# Patient Record
Sex: Male | Born: 1950 | Race: White | Hispanic: No | Marital: Married | Smoking: Current every day smoker
Health system: Southern US, Community
[De-identification: ages and names within clinical notes are randomized; demographics above are authoritative.]

## PROBLEM LIST (undated history)

## (undated) DIAGNOSIS — I10 Essential (primary) hypertension: Secondary | ICD-10-CM

## (undated) DIAGNOSIS — G4730 Sleep apnea, unspecified: Secondary | ICD-10-CM

## (undated) DIAGNOSIS — J189 Pneumonia, unspecified organism: Secondary | ICD-10-CM

## (undated) DIAGNOSIS — J449 Chronic obstructive pulmonary disease, unspecified: Secondary | ICD-10-CM

## (undated) DIAGNOSIS — M48061 Spinal stenosis, lumbar region without neurogenic claudication: Secondary | ICD-10-CM

## (undated) HISTORY — PX: SHOULDER ARTHROSCOPY W/ ROTATOR CUFF REPAIR: SHX2400

## (undated) HISTORY — PX: JOINT REPLACEMENT: SHX530

## (undated) HISTORY — PX: TOTAL HIP ARTHROPLASTY: SHX124

## (undated) HISTORY — PX: APPENDECTOMY: SHX54

## (undated) HISTORY — PX: OTHER SURGICAL HISTORY: SHX169

---

## 1999-10-08 ENCOUNTER — Encounter: Payer: Self-pay | Admitting: Vascular Surgery

## 1999-10-13 ENCOUNTER — Ambulatory Visit: Admission: RE | Admit: 1999-10-13 | Discharge: 1999-10-13 | Payer: Self-pay | Admitting: Vascular Surgery

## 1999-10-15 ENCOUNTER — Encounter (INDEPENDENT_AMBULATORY_CARE_PROVIDER_SITE_OTHER): Payer: Self-pay | Admitting: Specialist

## 1999-10-15 ENCOUNTER — Inpatient Hospital Stay: Admission: RE | Admit: 1999-10-15 | Discharge: 1999-10-17 | Payer: Self-pay | Admitting: Vascular Surgery

## 2001-01-18 ENCOUNTER — Encounter: Payer: Self-pay | Admitting: Vascular Surgery

## 2001-01-19 ENCOUNTER — Ambulatory Visit (HOSPITAL_COMMUNITY): Admission: RE | Admit: 2001-01-19 | Discharge: 2001-01-19 | Payer: Self-pay | Admitting: Vascular Surgery

## 2001-01-21 ENCOUNTER — Inpatient Hospital Stay (HOSPITAL_COMMUNITY): Admission: RE | Admit: 2001-01-21 | Discharge: 2001-01-23 | Payer: Self-pay | Admitting: Vascular Surgery

## 2001-01-21 ENCOUNTER — Encounter: Payer: Self-pay | Admitting: Vascular Surgery

## 2005-07-06 ENCOUNTER — Emergency Department (HOSPITAL_COMMUNITY): Admission: EM | Admit: 2005-07-06 | Discharge: 2005-07-06 | Payer: Self-pay | Admitting: Emergency Medicine

## 2006-10-07 ENCOUNTER — Ambulatory Visit (HOSPITAL_COMMUNITY): Admission: RE | Admit: 2006-10-07 | Discharge: 2006-10-07 | Payer: Self-pay | Admitting: Vascular Surgery

## 2006-10-20 ENCOUNTER — Inpatient Hospital Stay (HOSPITAL_COMMUNITY): Admission: RE | Admit: 2006-10-20 | Discharge: 2006-10-22 | Payer: Self-pay | Admitting: Vascular Surgery

## 2006-10-21 ENCOUNTER — Encounter: Payer: Self-pay | Admitting: Vascular Surgery

## 2006-11-02 ENCOUNTER — Ambulatory Visit: Payer: Self-pay | Admitting: Vascular Surgery

## 2007-05-24 ENCOUNTER — Ambulatory Visit: Payer: Self-pay | Admitting: Vascular Surgery

## 2007-10-31 ENCOUNTER — Ambulatory Visit: Payer: Self-pay | Admitting: Vascular Surgery

## 2010-12-09 ENCOUNTER — Other Ambulatory Visit (HOSPITAL_COMMUNITY): Payer: Self-pay

## 2010-12-09 ENCOUNTER — Other Ambulatory Visit (HOSPITAL_COMMUNITY): Payer: Self-pay | Admitting: Orthopedic Surgery

## 2010-12-09 DIAGNOSIS — Z9889 Other specified postprocedural states: Secondary | ICD-10-CM

## 2011-02-10 NOTE — Procedures (Signed)
BYPASS GRAFT EVALUATION   INDICATION:  Followup, bilateral fem-pop bypass graft.   HISTORY:  Diabetes:  No.  Cardiac:  No.  Hypertension:  Yes.  Smoking:  Yes.  Previous Surgery:  Please see above.   SINGLE LEVEL ARTERIAL EXAM                               RIGHT              LEFT  Brachial:                    145                148  Anterior tibial:             129                140  Posterior tibial:            127                146  Peroneal:  Ankle/brachial index:        0.87               0.99   PREVIOUS ABI:  Date: 05/24/07  RIGHT:  0.97  LEFT:  >1.0   LOWER EXTREMITY BYPASS GRAFT DUPLEX EXAM:   DUPLEX:  Patent bilateral fem-pop bypass graft with increased velocities  noted in the left inflow area.   IMPRESSION:  1. Patent bilateral femoropopliteal bypass graft with increased      velocity noted at the left inflow area.  2. Normal ankle brachial index with biphasic Doppler arterial waveform      noted in the left leg.  3. Mildly abnormal ankle brachial index with biphasic Doppler arterial      waveform noted in the right leg.   ___________________________________________  Quita Skye. Hart Rochester, M.D.   MG/MEDQ  D:  10/31/2007  T:  11/01/2007  Job:  [id number]

## 2011-02-10 NOTE — Procedures (Signed)
BYPASS GRAFT EVALUATION   INDICATION:  Follow up bilateral bypass grafts.   HISTORY:  Diabetes:  No  Cardiac:  No  Hypertension:  Yes  Smoking:  Less than one pack per day  Previous Surgery:  Right femoral to below-knee popliteal artery bypass  graft with nonreversed saphenous vein on 10/20/2006 by Dr. Hart Rochester.  Left femoral to below-knee popliteal artery bypass graft with  nonreversed saphenous vein on 01/21/2001 by Dr. Hart Rochester.  The patient is also status post right thromboendarterectomy of the  distal right external iliac, right common femoral and origin of the  superficial femoral with DPA on 10/05/1999 by Dr. Hart Rochester.   SINGLE LEVEL ARTERIAL EXAM                               RIGHT              LEFT  Brachial:                    140                140  Anterior tibial:             120                130  Posterior tibial:            136                164  Peroneal:  Ankle/brachial index:        0.97               Greater than 1.0   PREVIOUS ABI:  Date: 10/05/2006  RIGHT:  0.49  LEFT:  Greater than 1.0   LOWER EXTREMITY BYPASS GRAFT DUPLEX EXAM:   DUPLEX:  Doppler arterial waveforms are biphasic proximal to, throughout  and distal to the grafts bilaterally.  Elevated velocities in the left  common femoral artery are stable from previous exam.   IMPRESSION:  Patent bilateral femoral to popliteal artery bypass grafts.  Left ABI is stable, right ABI is improved from previous exam.   ___________________________________________  Quita Skye. Hart Rochester, M.D.   DP/MEDQ  D:  05/24/2007  T:  05/25/2007  Job:  [id number]

## 2011-02-13 NOTE — Discharge Summary (Signed)
[name], [name] NO.:  [id number]   MEDICAL RECORD NO.:  [id number]          PATIENT TYPE:  INP   LOCATION:  2033                         FACILITY:  [facility]   PHYSICIAN:  Constance Holster, PA    DATE OF BIRTH:  September 01, 1951   DATE OF ADMISSION:  10/20/2006  DATE OF DISCHARGE:                               DISCHARGE SUMMARY   DATE OF DISCHARGE:  Tentatively 1-2 days.   ADMISSION DIAGNOSIS:  Severe right calf claudication secondary to  femoral popliteal occlusive disease.   DISCHARGE DIAGNOSES:  1. 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  2. Hypertension.  3. Hyperlipidemia.   PROCEDURES:  October 20, 2006, the patient underwent a right common  popliteal below knee bypass graft in a non-reverse translocated  saphenous venous graft to the right leg with intraoperative arteriogram  by Dr. Estelle June.   HISTORY AND PHYSICAL:  This 60 year old male with past medical history  of hypertension and a history of lower extremity occlusive disease,  having undergone previously a left femoral-popliteal bypass graft by Dr.  Hart Rochester in April 2002 and a right femoral endarterectomy and distal  external iliac endarterectomy in January 2001.  The patient has an  excellent result; however, the left __________ developed progressive  claudication, is now able to walk 50 yards without having to stop  because of severe right calf discomfort.  The patient will occasionally  develop numbness in the right foot at rest.  He denies any history of  rest pain or non-healing ulcers, and when he attempted to play golf with  his son a few weeks ago, he was unable to proceed after one hole because  of his symptoms.  Angiography was performed on January 10th, which  revealed diffuse right superficial femoral occlusive disease with right  popliteal occlusion and three-vessel runoff below the knee, through the  below-the-knee popliteal artery.   Thus, __________ patient undergo left  to right femoral-popliteal bypass graft.  Risks and benefits are  explained.  The patient agreed to proceed.   HOSPITAL COURSE:  The patient's hospital course has been uneventful, and  he has progressed as expected.  On October 20, 2006, the patient  underwent a right femoral below-the-knee bypass graft without any  complications.  After surgery, the patient was transferred to step-down  unit on 3,300.  His blood pressure was stable at 160/55 a heart rate in  the 90s.   Post-op day #1, the patient's vital signs were stable.  He is afebrile.  The patient had a good appetite.  His Foley was discontinued.  The  patient's right foot was warm with a 2-3 dorsalis pedis palpable pulse.  The patient's incisions were dry.  The patient was hemoglobin and  hematocrit were stable.  His potassium is 3.7.  He was transferred to  2000.  The patient is to get out of bed and ambulate.   DISCHARGE DISPOSITION:  The patient will be discharged home the next 1  to 2 days, provided he is ambulating well, vital signs are  stable and he  remains afebrile.   MEDICATIONS:  1. Tylox 1-2 tablets every 4 hours p.r.n.  2. Lipitor 20 mg p.o. daily.  3. Aspirin 81 mg p.o. daily.   INSTRUCTIONS:  Patient instructed follow a low-fat, low-salt diet.  No  driving or heavy lifting greater than 10 pounds for 2 weeks.  The  patient is to ambulate 3 to 4 times daily and increase activity as  tolerated.  He may shower and clean his incisions with mild soap and  water.  Call the office if any wound  problems shall arise such as  incision erythema, drainage, temperature greater than 101.5.   FOLLOW UP:  The patient will follow with Dr. Hart Rochester in 3 weeks with  ABIs.  The office will contact him with time and date of appointment.      Constance Holster, PA     JMW/MEDQ  D:  10/21/2006  T:  10/21/2006  Job:  [id number]   cc:   Patient Hospital Chart  Quita Skye. Hart Rochester, M.D.

## 2011-02-13 NOTE — Op Note (Signed)
NAMEBASSEL, GASKILL                ACCOUNT NO.:  [id number]   MEDICAL RECORD NO.:  [id number]          PATIENT TYPE:  AMB   LOCATION:  SDS                          FACILITY:  [facility]   PHYSICIAN:  Quita Skye. Hart Rochester, M.D.  DATE OF BIRTH:  1951/01/22   DATE OF PROCEDURE:  10/07/2006  DATE OF DISCHARGE:                               OPERATIVE REPORT   PREOPERATIVE DIAGNOSIS:  Right superficial femoral occlusive disease  with severe claudication, right leg.   POSTOPERATIVE DIAGNOSIS:  Right superficial femoral occlusive disease  with severe claudication, right leg.   PROCEDURE:  Aortobifemoral angiogram with bilateral lower extremity  runoff via right common femoral approach.   SURGEON:  Quita Skye. Hart Rochester, M.D.   ANESTHESIA:  Local Xylocaine, contrast 220 mL.   COMPLICATIONS:  None.   DESCRIPTION OF PROCEDURE:  The patient was taken to [facility]  peripheral endovascular placed in the supine position at which time both  groins were prepped with Betadine solution, draped in routine sterile  manner.  After infiltration of 1% Xylocaine, right common femoral artery  was entered percutaneously.  A Rosen guide wire passed into the  suprarenal aorta under fluoroscopic guidance.  5-French sheath and  dilator were passed over the guide wire, dilator removed.  A standard  pigtail catheter positioned in the suprarenal aorta.  Flush abdominal  aortogram was performed injecting 20 mL contrast 20 mL per second.  This  revealed the aorta to be widely patent with single renal arteries  bilaterally which were widely patent, no evidence of stenosis.  Aorta  was patent down to the bifurcation with both common, internal and  external iliac arteries relatively unremarkable until the distal aspect  on the right side.  Catheter was withdrawn into the terminal aorta and  bilateral lower extremity runoff performed injecting 88 mL contrast 8 mL  per second.  This revealed the right external iliac to have a  relatively  focal stenosis in its midportion approximating 60% in severity with a  dilated distal external iliac and common femoral artery where previous  patch angioplasty been performed.  On the right side the superficial  femoral artery was patent proximally but did have significant disease in  the midportion over a 2 cm segment, was then patent down to above the  knee where was totally occluded.  The below-knee popliteal artery was  widely patent with three-vessel runoff.  This was confirmed by different  views using the peak hold technique.  On the left side, the external  iliac artery was widely patent, there was a patent left femoral to below-  knee popliteal bypass with three-vessel runoff of with no areas of  stenosis.  Having tolerated procedure well, sheath was removed, adequate  compression applied.  No complications ensued.   FINDINGS:  1. Widely patent aortobi-iliac system with exception of a 60% focal      right external iliac stenosis at the proximal tip of the previous      patch angioplasty.  2. Widely patent left femoral-popliteal bypass graft with three-vessel      runoff.  3. Diffuse  severe right superficial femoral occlusive disease with      moderately severe stenosis in the mid thigh and total occlusion      above-the-knee with reconstitution of below-knee popliteal artery      and three-vessel runoff on the right.           ______________________________  Quita Skye. Hart Rochester, M.D.     JDL/MEDQ  D:  10/07/2006  T:  10/07/2006  Job:  [id number]

## 2011-02-13 NOTE — Op Note (Signed)
[name], [name]                ACCOUNT NO.:  [id number]   MEDICAL RECORD NO.:  [id number]          PATIENT TYPE:  INP   LOCATION:  2550                         FACILITY:  [facility]   PHYSICIAN:  Quita Skye. Hart Rochester, M.D.  DATE OF BIRTH:  1950/11/24   DATE OF PROCEDURE:  10/20/2006  DATE OF DISCHARGE:                               OPERATIVE REPORT   PREOPERATIVE DIAGNOSIS:  Severe right superficial femoral, popliteal and  tibial occlusive disease with severe claudication, right leg.   POSTOPERATIVE DIAGNOSIS:  Severe right superficial femoral, popliteal  and tibial occlusive disease with severe claudication, right leg.   OPERATIONS:  Right common femoral to popliteal (below knee) bypass using  a non-reversed, translocated saphenous vein graft from right leg with  intraoperative arteriogram.   SURGEON:  Quita Skye. Hart Rochester, M.D.   FIRST ASSISTANT:  Rowe Clack, P.A.-C.   ANESTHESIA:  General endotracheal.   PROCEDURE:  The patient was taken to the operating room, placed in  supine position at which time satisfactory general endotracheal  anesthesia was administered.  The right leg was prepped with Betadine  scrub and solution and draped in a routine sterile manner.  Longitudinal  incision was made through the previous scar in the right groin, carried  down to subcutaneous tissue.  The common femoral and distal external  iliac artery had previously been treated with endarterectomy and patch  angioplasty and this was dissected free for proximal control down to the  origin of the profunda.  There was excellent pulse.  Saphenous vein was  exposed at the saphenofemoral junction and removed down to the mid calf  through multiple incisions on the medial aspect of right leg.  Its  branches were ligated with 4-0 and 5-0 silk ties, divided.  It was  removed and gently dilated heparinized saline and marked for orientation  purposes.  It was an adequate vein being at least 2.5 mm in size  throughout.  The popliteal artery was exposed in the below-knee  position.  It was a diseased vessel, particularly distally but was  widely patent on the angiogram and was relatively soft proximally.  Subfascial tunnel was created.  The patient was then heparinized.  The  femoral vessels were occluded with vascular clamps.  Longitudinal  opening made through the previous Dacron hood of the Dacron patch over  the common femoral artery and enlarged with am 11-blade.  Proximal end  of the saphenous vein was anastomosed end-to-side with 6-0 Prolene.  Clamps then released.  There was good pulse down to the first set of  competent valves.  Using a retrograde valvulotome the valves were  rendered incompetent with resultant excellent flow out of the distal in  the vein graft.  Vein was carefully delivered through the tunnel and  anastomosed end-to-side to the below-knee popliteal artery with 6-0  Prolene.  The vessel would easily accept a 2.5 mm to 3 mm dilator  distally although it was diseased with plaque.  Following completion of  the anastomosis, intraoperative arteriogram was performed which revealed  a widely patent anastomosis and the runoff primarily through  the  anterior tibial and  peroneal arteries.  Protamine was given to reverse the heparin.  Following adequate hemostasis, wound was irrigated with saline, closed  in layers with Vicryl subcuticular fashion.  Sterile dressing applied.  The patient taken to recovery room in satisfactory condition.           ______________________________  Quita Skye Hart Rochester, M.D.     JDL/MEDQ  D:  10/20/2006  T:  10/20/2006  Job:  [id number]

## 2011-02-13 NOTE — H&P (Signed)
[name], [name]                ACCOUNT NO.:  [id number]   MEDICAL RECORD NO.:  [id number]          PATIENT TYPE:  AMB   LOCATION:  SDS                          FACILITY:  [facility]   PHYSICIAN:  Quita Skye. Hart Rochester, M.D.  DATE OF BIRTH:  07-27-1951   DATE OF ADMISSION:  10/07/2006  DATE OF DISCHARGE:                              HISTORY & PHYSICAL   CHIEF COMPLAINT:  Severe right calf claudication secondary to femoral  popliteal occlusive disease.   HISTORY OF PRESENT ILLNESS:  This 60 year old male patient has a history  of lower extremity occlusive disease, having previously undergone a left  femoral-popliteal bypass grafting by me in April 2002 and a right  femoral endarterectomy and distal external iliac endarterectomy in  January of 2001.  He has had an excellent result, but over the last 8  weeks has developed progressive claudication and is now unable to walk  50 yards without having to stop because of severe right calf discomfort.  He will occasionally developed numbness in the right foot at rest.  He  has no history of rest pain or nonhealing ulcers, and when he attempted  to play golf with his son a few weeks ago, he was unable to proceed  after one hole because of his symptoms.   PAST MEDICAL HISTORY:  Is negative for diabetes, coronary artery  disease, COPD, stroke.  He does have a history of hypertension which was  treated medically in the past but has not been treated for the past 2  years and is doing well.  Does have hyperlipidemia.   MEDICATIONS INCLUDE:  Lipitor 20 mg p.o. daily and aspirin 81 mg p.o.  daily.   FAMILY HISTORY:  Is positive for stroke in his mother, diabetes in a  grandmother and negative for coronary artery disease.   SOCIAL HISTORY:  Married, has three children, works in Insurance account manager.  He  smokes one pack of cigarettes per day; he is trying to quit.  Does not  use alcohol.   REVIEW OF SYSTEMS:  Denies any chest pain, dyspnea on exertion, PND,  orthopnea, anorexia, weight loss, abdominal symptoms such as  hematemesis, melena, recent changes in bowel habits or significant  urologic dysfunction.   ALLERGIES:  None known.   On physical exam, blood pressure 140/84, heart rate 76, respirations are  18.  GENERAL:  He is a healthy-appearing, middle-aged male who is in no  apparent distress.  Alert and oriented x3.  His neck is supple with 3+ carotid pulses palpable.  No bruits were  audible.  There was no thyromegaly palpable.  NEUROLOGIC EXAM:  Is normal.  UPPER EXTREMITY EXAM:  Reveals 3+ pulses bilaterally at the brachial and  radial level.  CHEST:  Clear to auscultation.  CARDIOVASCULAR EXAM:  Reveals a regular rhythm with no murmurs.  ABDOMEN:  Soft, nontender with no palpable masses.  Right leg has a 3+ femoral pulse.  No popliteal or distal pulses are  palpable.  There is adequate perfusion at rest with no ulcerations or  ischemia.  Left leg has a 3+ femoral pulse, 2+ popliteal  and 2+ dorsalis  pedis pulse palpable.   Angiography was performed on January 10 which revealed diffuse right  superficial femoral occlusive disease with right popliteal occlusion and  three-vessel runoff below the knee through the below-knee popliteal  artery.   IMPRESSION:  1. Severe right femoral popliteal occlusive disease with severe      claudication, right leg.  2. Status post left femoral-popliteal bypass grafting with good      result.  3. Hyperlipidemia.  4. Tobacco abuse.   PLAN:  Is to admit the patient on January 23 for an elective right  femoral-popliteal bypass graft.  Risks and benefits have been fully  discussed with him and his wife, and they would like to proceed.           ______________________________  Quita Skye Hart Rochester, M.D.     JDL/MEDQ  D:  10/07/2006  T:  10/07/2006  Job:  [id number]   cc:   Adalberto Cole

## 2011-02-13 NOTE — Discharge Summary (Signed)
[place]  Patient:    [name], [name]                       MRN: [id number] Adm. Date:  [id number] Disc. Date: [id number] Attending:  Colvin Caroli Dictator:   Lissa Merlin, P.A.                           Discharge Summary  ADDENDUM:  Mr. Pipkins will not have Percocet for pain.  He will instead have Darvocet-N 100 one to two p.o. q.4-6h. p.r.n. for pain. DD:  01/22/01 TD:  01/24/01 Job: [id number]

## 2011-02-13 NOTE — H&P (Signed)
[place]  Patient:    [name], [name]                         MRN: [id number] Adm. Date:  01/21/01 Attending:  Quita Skye. Hart Rochester, M.D. Dictator:   Durenda Age, P.A.-C.                         History and Physical  DATE OF BIRTH:  [birth year]  CHIEF COMPLAINT:  Left femoral-popliteal occlusive disease.  HISTORY OF PRESENT ILLNESS:  A 60 year old white male with known history of PVOD, presented on January 18, 2001, with complaints of claudication symptoms in the left calf, after walking less than 30 yards, having to stop around 60 yards after starting.  Dopplers at the office revealed a left ABI of 0.68, with a right ABI of 0.098.  This was essentially unchanged from the prior Dopplers on November 18, 1999, (left ABI 0.68).  However, due to his limiting symptoms, the patient request surgical treatment.  He underwent an arteriogram on January 19, 2001, which apparently confirmed the diagnosis; no records are available at this time.  Upon evaluation, Dr. Hart Rochester recommended that the patient undergo left femoral-popliteal bypass graft, possibly using vein, on January 21, 2001, for the relief of symptoms.  No buttock, hip, thigh, or foot pain.  The pain is localized in the calf.  The pain is not present at rest or at night.  He denies any slow healing ulcers, gangrenous changes, or ischemic changes.  No peripheral edema or decrease in temperature.  No shortness of breath or dyspnea on exertion after walking.  No chest pain or palpitations.  PAST MEDICAL HISTORY:  PVOD - claudication, hypertension, continuous tobacco abuse, although he is trying to quit.  Hypercholesterolemia.  DJD.  PAST SURGICAL HISTORY:  Status post angiogram on January 19, 2001, Dr. Hart Rochester to both right distal external iliac, right common femoral artery and proximal right superficial femoral artery.  Endarterectomy with Dacron patch angioplasty on October 15, 1999, Dr. Hart Rochester.  Status  post angiogram on October 13, 1999, Dr. Hart Rochester.  MEDICATIONS: 1. Plavix 75 mg p.o. q.d. 2. Hyzaar 50 -12.5 mg q.d.  ALLERGIES:  NKDA.  REVIEW OF SYSTEMS:  See HPI and past medical history for significant positives.  No diabetes, kidney disease or asthma.  FAMILY HISTORY:  Mother alive with a history of CVA, also of diabetes.  Father alive and well.  SOCIAL HISTORY:  Married, three children.  He is an Geologist, engineering in a factory. He smokes 1-1/2 packs a day of cigarettes for 30 years, although he is seriously trying to quit.  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  He only drinks alcohol on social occasions.  PHYSICAL EXAMINATION:  GENERAL:  A well-developed, well-nourished, 60 year old, white male in no acute distress, alert and oriented x 3.  VITAL SIGNS:  Blood pressure 110/70, pulse 80, respirations 16.  HEENT:  Head:  Normocephalic, atraumatic.  PERRLA.  EOMI.  Funduscopic examination within normal limits.  NECK:  Supple.  No JVD, bruits, or lymphadenopathy.  CHEST:  Symmetrical on inspirations.  LUNGS:  Show mild anterior wheezing, and a trace of diffuse rhonchi bilaterally.  These sounds appear to be chronic.  No rales or lymphadenopathy.  CARDIOVASCULAR:  Regular rate and rhythm.  No murmurs, rubs or gallops.  ABDOMEN:  Soft, nontender.  Bowel sounds x 4.  No masses  or bruits.  GU/RECTAL:  Deferred.  EXTREMITIES:  No clubbing, cyanosis, or edema.  In the left groin, there is a presence of ecchymosis, after the patient had undergone an angiogram, but no visible infection is present in that area.  No bruit audible.  He has bilateral tattoos to his arms.  Temperature warm.  PULSES:  Peripheral pulses, carotid 2+ bilaterally.  Femoral 2+ bilaterally. Popliteal dorsalis pedis and posterior tibialis 2+ on the right, absent on the left.  NEUROLOGICAL:  Nonfocal.  Gait steady.  DTRs 2+ bilaterally.  Muscle strength 5/5.  ASSESSMENT AND PLAN:   Left femoral-popliteal occlusive disease for left femoral-popliteal bypass graft on January 21, 2001, Dr. Hart Rochester.  Dr. Hart Rochester has seen and evaluated this patient prior to the admission and has explained the risks and benefits involving the procedure and the patient consented to continue. DD:  01/20/01 TD:  01/20/01 Job: [id number]

## 2011-02-13 NOTE — Discharge Summary (Signed)
[name]. [facility]  Patient:    [name], [name]                       MRN: [id number] Adm. Date:  [id number] Disc. Date: [id number] Attending:  Colvin Caroli Dictator:   Lissa Merlin, P.A. CC:         Truddie Crumble, M.D.   Discharge Summary  DATE OF BIRTH:  2051-08-07  PRIMARY CARE PHYSICIAN:  Truddie Crumble, M.D.  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  PREVIOUS MEDICAL HISTORY: 1. 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 2. Hypertension. 3. Hypercholesterolemia. 4. Smoking. 5. Degenerative joint disease.  PROCEDURES: 1. Abdominal aortogram with bilateral lower extremity runoff on January 19, 2001. 2. Left femoral to below the knee popliteal bypass graft with nonreverse    greater saphenous vein on January 21, 2001.  BRIEF HISTORY:  [name] is a 60 year old male with known peripheral vascular occlusive disease and previous endarterectomy in January of 2001 as outlined above.  He has been doing well with regard to that procedure, however, he began to become limited in his activities secondary to left calf claudication.  He was referred to [facility] Sky[facility]. Hart Rochester, M.D., who arranged for angiography.  He also discussed left femoropopliteal bypass graft with Mr. Wyss.  The risks, benefits, details, and alternatives were discussed.  HOSPITAL COURSE:  [name] came into the hospital on January 19, 2001, for the elective procedure.  He underwent abdominal aortogram with bilateral lower extremity runoff.  His bilateral renal arteries were widely patent.  His left superficial femoral artery was occluded with reconstitution of above the knee popliteal and three-vessel runoff.  He had a widely  patent right SFA, popliteal, and three vessel runoff.  It was agreed to do left femoropopliteal.  He underwent left femoral to below the knee popliteal bypass graft on January 21, 2001, with no complications.  He was taken to the PACU in stable condition.  Postoperatively [name] has done very well with no problems. He has done well with routine care.  He currently is afebrile with stable vital signs.  He has good pulses in his lower extremities.  Laboratory work is satisfactory.  It is anticipated that he should be suitable for discharge pending satisfactory morning rounds on January 23, 2001.  DISCHARGE MEDICATIONS: 1. Plavix 75 mg one p.o. q.d. 2. Hyzaar 12.5 mg one p.o. q.d. 3. Percocet 5/325 mg one to two p.o. q.4-6h. p.r.n. for pain.  ALLERGIES:  No known drug allergies.  ACTIVITY:  Mr. Schwer is to do no driving and no heavy lifting or strenuous activity.  He is told to walk daily and to stay active.  WOUND CARE:  He is told that he can shower.  He is told to watch his wounds for increasing redness, swelling, drainage, or fever and to clean them gently with soap and water.  SPECIAL INSTRUCTIONS:  He is told to stop smoking.  FOLLOW-UP: 1. He will [facility] Sky[facility]. Hart Rochester, M.D., in two to three weeks after discharge with    repeat ABIs. 2. He will return to the CVTS office around Feb 01, 2001, for staple    discontinuation. DD:  01/22/01 TD:  01/24/01 Job: [id number]

## 2011-02-13 NOTE — Procedures (Signed)
[place]. [facility]  Patient:    [name], [name]                       MRN: [id number] Proc. Date: 01/19/01 Adm. Date:  [id number] Attending:  Colvin Caroli                           Procedure Report  PREOPERATIVE DIAGNOSIS:  Claudication left lower extremity secondary to femoral-popliteal occlusive disease.  PROCEDURE:  Abdominal aortogram with bilateral lower extremity runoff via left common femoral approach.  SURGEON:  Quita Skye. Hart Rochester, M.D.  ANESTHESIA:  Local Xylocaine and Versed 2 mg intravenously.  DESCRIPTION OF PROCEDURE:  The patient was taken to the [facility] lab, placed in the supine position, at which time both groins were prepped with Betadine solution, draped in a routine sterile manner. After infiltration of 1% Xylocaine, the left common femoral artery was entered percutaneously and guidewire passed into the suprarenal aorta under fluoroscopic guidance.  A 5-French sheath and dilator were passed over the guidewire.  Dilator removed and a standard pigtail catheter positioned in the suprarenal aorta.  A flush abdominal aortogram was performed injecting 20 cc of contrast at 20 cc per second.  This revealed the aorta to be widely patent with two renal arteries on each side, both of which were widely patent on each side.  The largest of these were the superior renal arteries bilaterally. Inferior mesenteric artery was also patent.  Both common iliac arteries were widely patent with no evidence of significant occlusive disease.  The catheter was withdrawn into the terminal aorta and bilateral lower extremity angiography performed by injecting 88 cc of contrast at 8 cc per second.  This revealed both common iliac and internal iliac arteries to be widely patent. The external iliac on the left appeared to be mildly narrowed.  The right side had had a previous patch angioplasty and was widely patent across the  inguinal ligament region.  The common femoral artery on the right, superficial femoral artery, profunda femoris artery, popliteal artery were all patent and there was three-vessel runoff on the right leg with the best vessels being the anterior tibial and the peroneal.  On the left leg, the common femoral artery was widely patent.  There was total occlusion of the superficial femoral artery at its origin with reconstitution on the lower third of the thigh with a widely patent popliteal artery crossing the knee joint and three-vessel runoff on the left.  Additional views of the distal external iliac and common femoral artery on the left were performed injecting 20 cc of contrast at 12 cc per second using both the RAO and the LAO projections and this revealed no significant narrowing in the distal external iliac or common femoral artery on the left side.  Having tolerated the procedure well, the sheath was removed. Adequate compression applied.  No complications ensued.  FINDINGS: 1. Widely patent aorto/common iliac system with paired renal arteries    bilaterally with no significant stenoses. 2. Widely patent right external iliac and common femoral endarterectomy site    with normal runoff on the right with the best tibial vessel being the    peroneal and anterior tibial. 3. Left superficial femoral occlusion reconstitution of popliteal artery above    the knee to 10 cm with good three-vessel runoff on the left. DD:  01/19/01 TD:  01/19/01 Job: [id number]

## 2011-02-13 NOTE — Op Note (Signed)
[place]. [facility]  Patient:    [name], [name]                         MRN: [id number] Proc. Date: 01/21/01 Attending:  Quita Skye. Hart Rochester, M.D.                           Operative Report  PREOPERATIVE DIAGNOSIS:  Left superficial femoral occlusion with limiting claudication.  POSTOPERATIVE DIAGNOSIS:  Left superficial femoral occlusion with limiting claudication.  OPERATIVE PROCEDURE:  Left common femoral to popliteal (below knee) bypass using a nonreversed translocated saphenous vein graft in the left leg with intraoperative arteriogram.  SURGEON:  Quita Skye. Hart Rochester, M.D.  ASSISTANT:  Norton Blizzard, M.D.; Dominica Severin, P.A.  ANESTHESIA:  General endotracheal.  DESCRIPTION OF PROCEDURE:  The patient was taken to the operating room and placed in the supine position at which time the left lower extremity was prepped with Betadine scrub and solution and draped in the routine sterile manner after the induction of satisfactory general endotracheal anesthesia. The saphenous vein was exposed from the saphenofemoral junction to the proximal calf through multiple incisions along the medial aspect of the left leg, its branches ligated with 4-0 and 5-0 silk ties and divided. It was removed and gently dilated with heparinized saline and marked for orientation purposes. It was an adequate vein being 3.5-4.0 mm in size. The common superficial and profunda femoris arteries were dissected free in the groin. There was a posterior plaque in the common femoral artery at about the level of the inguinal ligament with no significant stenosis on the angiogram and an excellent pulse distally. The popliteal artery was exposed below the knee. A subfascial tunnel was created and the patient was heparinized. The femoral vessels were occluded with vascular clamps, a longitudinal opening made in the common femoral artery with a #15 blade and extended with the Potts scissors. The  proximal end of the vein was spatulated and anastomosed end-to-side using continuous 6-0 Prolene. Following this, the retrograde valvulotome was used to render the valves incompetent and there was excellent flow out of the distal end of the vein graft. The vein was carefully delivered through the tunnel and anastomosed end-to-side to the below knee popliteal artery with 6-0 Prolene. The clamps were then released and there was an excellent pulse in the vein graft and allowed Doppler flow in the posterior tibial and anterior tibial arteries distally. Intraoperative arteriogram revealed a widely patent anastomosis with three vessel runoff. The popliteal artery was also of good caliber above the knee. No Protamine was given. The wound was irrigated with saline and closed in layers with Vicryl in subcuticular fashion with clips. Sterile dressing was applied. The patient was taken to the recovery room in satisfactory condition. DD:  01/21/01 TD:  01/22/01 Job: [id number]

## 2013-06-27 DIAGNOSIS — G4733 Obstructive sleep apnea (adult) (pediatric): Secondary | ICD-10-CM | POA: Insufficient documentation

## 2013-06-27 DIAGNOSIS — R0683 Snoring: Secondary | ICD-10-CM | POA: Insufficient documentation

## 2013-06-27 DIAGNOSIS — E291 Testicular hypofunction: Secondary | ICD-10-CM | POA: Insufficient documentation

## 2013-10-03 DIAGNOSIS — N529 Male erectile dysfunction, unspecified: Secondary | ICD-10-CM | POA: Insufficient documentation

## 2015-05-29 DIAGNOSIS — J449 Chronic obstructive pulmonary disease, unspecified: Secondary | ICD-10-CM | POA: Insufficient documentation

## 2015-05-29 DIAGNOSIS — G4734 Idiopathic sleep related nonobstructive alveolar hypoventilation: Secondary | ICD-10-CM | POA: Insufficient documentation

## 2016-02-14 DIAGNOSIS — I77811 Abdominal aortic ectasia: Secondary | ICD-10-CM | POA: Insufficient documentation

## 2016-02-14 DIAGNOSIS — F17210 Nicotine dependence, cigarettes, uncomplicated: Secondary | ICD-10-CM | POA: Insufficient documentation

## 2016-02-14 DIAGNOSIS — R1011 Right upper quadrant pain: Secondary | ICD-10-CM | POA: Insufficient documentation

## 2016-05-02 DIAGNOSIS — M25552 Pain in left hip: Secondary | ICD-10-CM | POA: Insufficient documentation

## 2016-10-02 DIAGNOSIS — M169 Osteoarthritis of hip, unspecified: Secondary | ICD-10-CM | POA: Insufficient documentation

## 2016-10-02 DIAGNOSIS — E785 Hyperlipidemia, unspecified: Secondary | ICD-10-CM | POA: Diagnosis present

## 2016-10-02 DIAGNOSIS — I77811 Abdominal aortic ectasia: Secondary | ICD-10-CM | POA: Diagnosis present

## 2016-10-02 DIAGNOSIS — Z79899 Other long term (current) drug therapy: Secondary | ICD-10-CM | POA: Diagnosis not present

## 2016-10-02 DIAGNOSIS — J449 Chronic obstructive pulmonary disease, unspecified: Secondary | ICD-10-CM | POA: Diagnosis present

## 2016-10-02 DIAGNOSIS — M1612 Unilateral primary osteoarthritis, left hip: Secondary | ICD-10-CM | POA: Diagnosis present

## 2016-10-02 DIAGNOSIS — E669 Obesity, unspecified: Secondary | ICD-10-CM | POA: Diagnosis present

## 2016-10-02 DIAGNOSIS — I10 Essential (primary) hypertension: Secondary | ICD-10-CM | POA: Diagnosis present

## 2016-10-02 DIAGNOSIS — F17200 Nicotine dependence, unspecified, uncomplicated: Secondary | ICD-10-CM | POA: Diagnosis present

## 2016-10-02 DIAGNOSIS — Z885 Allergy status to narcotic agent status: Secondary | ICD-10-CM | POA: Diagnosis not present

## 2016-10-02 DIAGNOSIS — Z888 Allergy status to other drugs, medicaments and biological substances status: Secondary | ICD-10-CM | POA: Diagnosis not present

## 2016-10-02 DIAGNOSIS — Z6831 Body mass index (BMI) 31.0-31.9, adult: Secondary | ICD-10-CM | POA: Diagnosis not present

## 2016-10-02 DIAGNOSIS — G4733 Obstructive sleep apnea (adult) (pediatric): Secondary | ICD-10-CM | POA: Diagnosis present

## 2016-10-05 DIAGNOSIS — H9190 Unspecified hearing loss, unspecified ear: Secondary | ICD-10-CM | POA: Diagnosis not present

## 2016-10-05 DIAGNOSIS — J449 Chronic obstructive pulmonary disease, unspecified: Secondary | ICD-10-CM | POA: Diagnosis not present

## 2016-10-05 DIAGNOSIS — I10 Essential (primary) hypertension: Secondary | ICD-10-CM | POA: Diagnosis not present

## 2016-10-05 DIAGNOSIS — Z471 Aftercare following joint replacement surgery: Secondary | ICD-10-CM | POA: Diagnosis not present

## 2016-10-05 DIAGNOSIS — E785 Hyperlipidemia, unspecified: Secondary | ICD-10-CM | POA: Diagnosis not present

## 2016-10-05 DIAGNOSIS — Z96642 Presence of left artificial hip joint: Secondary | ICD-10-CM | POA: Diagnosis not present

## 2016-10-07 DIAGNOSIS — H9190 Unspecified hearing loss, unspecified ear: Secondary | ICD-10-CM | POA: Diagnosis not present

## 2016-10-07 DIAGNOSIS — Z471 Aftercare following joint replacement surgery: Secondary | ICD-10-CM | POA: Diagnosis not present

## 2016-10-07 DIAGNOSIS — J449 Chronic obstructive pulmonary disease, unspecified: Secondary | ICD-10-CM | POA: Diagnosis not present

## 2016-10-07 DIAGNOSIS — I10 Essential (primary) hypertension: Secondary | ICD-10-CM | POA: Diagnosis not present

## 2016-10-07 DIAGNOSIS — Z96642 Presence of left artificial hip joint: Secondary | ICD-10-CM | POA: Diagnosis not present

## 2016-10-07 DIAGNOSIS — E785 Hyperlipidemia, unspecified: Secondary | ICD-10-CM | POA: Diagnosis not present

## 2016-10-09 DIAGNOSIS — H9190 Unspecified hearing loss, unspecified ear: Secondary | ICD-10-CM | POA: Diagnosis not present

## 2016-10-09 DIAGNOSIS — J449 Chronic obstructive pulmonary disease, unspecified: Secondary | ICD-10-CM | POA: Diagnosis not present

## 2016-10-09 DIAGNOSIS — Z471 Aftercare following joint replacement surgery: Secondary | ICD-10-CM | POA: Diagnosis not present

## 2016-10-09 DIAGNOSIS — Z96642 Presence of left artificial hip joint: Secondary | ICD-10-CM | POA: Diagnosis not present

## 2016-10-09 DIAGNOSIS — E785 Hyperlipidemia, unspecified: Secondary | ICD-10-CM | POA: Diagnosis not present

## 2016-10-09 DIAGNOSIS — I10 Essential (primary) hypertension: Secondary | ICD-10-CM | POA: Diagnosis not present

## 2016-10-12 DIAGNOSIS — I10 Essential (primary) hypertension: Secondary | ICD-10-CM | POA: Diagnosis not present

## 2016-10-12 DIAGNOSIS — J449 Chronic obstructive pulmonary disease, unspecified: Secondary | ICD-10-CM | POA: Diagnosis not present

## 2016-10-12 DIAGNOSIS — E785 Hyperlipidemia, unspecified: Secondary | ICD-10-CM | POA: Diagnosis not present

## 2016-10-12 DIAGNOSIS — Z471 Aftercare following joint replacement surgery: Secondary | ICD-10-CM | POA: Diagnosis not present

## 2016-10-12 DIAGNOSIS — M1612 Unilateral primary osteoarthritis, left hip: Secondary | ICD-10-CM | POA: Diagnosis not present

## 2016-10-12 DIAGNOSIS — Z96642 Presence of left artificial hip joint: Secondary | ICD-10-CM | POA: Diagnosis not present

## 2016-10-12 DIAGNOSIS — H9190 Unspecified hearing loss, unspecified ear: Secondary | ICD-10-CM | POA: Diagnosis not present

## 2016-10-19 DIAGNOSIS — E785 Hyperlipidemia, unspecified: Secondary | ICD-10-CM | POA: Diagnosis not present

## 2016-10-19 DIAGNOSIS — H9190 Unspecified hearing loss, unspecified ear: Secondary | ICD-10-CM | POA: Diagnosis not present

## 2016-10-19 DIAGNOSIS — J449 Chronic obstructive pulmonary disease, unspecified: Secondary | ICD-10-CM | POA: Diagnosis not present

## 2016-10-19 DIAGNOSIS — Z471 Aftercare following joint replacement surgery: Secondary | ICD-10-CM | POA: Diagnosis not present

## 2016-10-19 DIAGNOSIS — I10 Essential (primary) hypertension: Secondary | ICD-10-CM | POA: Diagnosis not present

## 2016-10-19 DIAGNOSIS — Z96642 Presence of left artificial hip joint: Secondary | ICD-10-CM | POA: Diagnosis not present

## 2016-10-26 DIAGNOSIS — M48061 Spinal stenosis, lumbar region without neurogenic claudication: Secondary | ICD-10-CM | POA: Insufficient documentation

## 2016-10-26 DIAGNOSIS — M5416 Radiculopathy, lumbar region: Secondary | ICD-10-CM

## 2016-11-05 DIAGNOSIS — F17210 Nicotine dependence, cigarettes, uncomplicated: Secondary | ICD-10-CM | POA: Diagnosis not present

## 2016-11-05 DIAGNOSIS — E785 Hyperlipidemia, unspecified: Secondary | ICD-10-CM | POA: Diagnosis not present

## 2016-11-05 DIAGNOSIS — E7800 Pure hypercholesterolemia, unspecified: Secondary | ICD-10-CM | POA: Diagnosis not present

## 2016-11-05 DIAGNOSIS — I10 Essential (primary) hypertension: Secondary | ICD-10-CM | POA: Diagnosis not present

## 2016-11-05 DIAGNOSIS — Z79899 Other long term (current) drug therapy: Secondary | ICD-10-CM | POA: Diagnosis not present

## 2016-11-05 DIAGNOSIS — J449 Chronic obstructive pulmonary disease, unspecified: Secondary | ICD-10-CM | POA: Diagnosis not present

## 2016-11-05 DIAGNOSIS — J9611 Chronic respiratory failure with hypoxia: Secondary | ICD-10-CM | POA: Diagnosis not present

## 2016-11-05 DIAGNOSIS — I739 Peripheral vascular disease, unspecified: Secondary | ICD-10-CM | POA: Diagnosis not present

## 2016-11-05 DIAGNOSIS — G4733 Obstructive sleep apnea (adult) (pediatric): Secondary | ICD-10-CM | POA: Diagnosis not present

## 2016-11-05 DIAGNOSIS — Z9989 Dependence on other enabling machines and devices: Secondary | ICD-10-CM | POA: Diagnosis not present

## 2016-11-09 DIAGNOSIS — M25552 Pain in left hip: Secondary | ICD-10-CM | POA: Diagnosis not present

## 2016-11-24 DIAGNOSIS — L821 Other seborrheic keratosis: Secondary | ICD-10-CM | POA: Diagnosis not present

## 2016-11-24 DIAGNOSIS — L579 Skin changes due to chronic exposure to nonionizing radiation, unspecified: Secondary | ICD-10-CM | POA: Diagnosis not present

## 2016-11-24 DIAGNOSIS — D225 Melanocytic nevi of trunk: Secondary | ICD-10-CM | POA: Diagnosis not present

## 2016-11-24 DIAGNOSIS — L820 Inflamed seborrheic keratosis: Secondary | ICD-10-CM | POA: Diagnosis not present

## 2016-11-24 DIAGNOSIS — L814 Other melanin hyperpigmentation: Secondary | ICD-10-CM | POA: Diagnosis not present

## 2016-11-24 DIAGNOSIS — Z85828 Personal history of other malignant neoplasm of skin: Secondary | ICD-10-CM | POA: Diagnosis not present

## 2016-12-15 DIAGNOSIS — Z96642 Presence of left artificial hip joint: Secondary | ICD-10-CM | POA: Insufficient documentation

## 2016-12-15 DIAGNOSIS — G4700 Insomnia, unspecified: Secondary | ICD-10-CM | POA: Insufficient documentation

## 2016-12-15 DIAGNOSIS — I10 Essential (primary) hypertension: Secondary | ICD-10-CM | POA: Diagnosis not present

## 2016-12-15 DIAGNOSIS — M25552 Pain in left hip: Secondary | ICD-10-CM | POA: Diagnosis not present

## 2016-12-16 DIAGNOSIS — G4733 Obstructive sleep apnea (adult) (pediatric): Secondary | ICD-10-CM | POA: Diagnosis not present

## 2016-12-24 DIAGNOSIS — M25552 Pain in left hip: Secondary | ICD-10-CM | POA: Diagnosis not present

## 2016-12-24 DIAGNOSIS — M6281 Muscle weakness (generalized): Secondary | ICD-10-CM | POA: Diagnosis not present

## 2016-12-24 DIAGNOSIS — M25652 Stiffness of left hip, not elsewhere classified: Secondary | ICD-10-CM | POA: Diagnosis not present

## 2017-01-25 DIAGNOSIS — Z974 Presence of external hearing-aid: Secondary | ICD-10-CM | POA: Diagnosis not present

## 2017-01-25 DIAGNOSIS — H9193 Unspecified hearing loss, bilateral: Secondary | ICD-10-CM | POA: Diagnosis not present

## 2017-01-25 DIAGNOSIS — I10 Essential (primary) hypertension: Secondary | ICD-10-CM | POA: Diagnosis not present

## 2017-01-25 DIAGNOSIS — Z0000 Encounter for general adult medical examination without abnormal findings: Secondary | ICD-10-CM | POA: Diagnosis not present

## 2017-01-25 DIAGNOSIS — Z6832 Body mass index (BMI) 32.0-32.9, adult: Secondary | ICD-10-CM | POA: Diagnosis not present

## 2017-01-25 DIAGNOSIS — Z23 Encounter for immunization: Secondary | ICD-10-CM | POA: Diagnosis not present

## 2017-02-17 DIAGNOSIS — S299XXA Unspecified injury of thorax, initial encounter: Secondary | ICD-10-CM | POA: Diagnosis not present

## 2017-02-17 DIAGNOSIS — S40812A Abrasion of left upper arm, initial encounter: Secondary | ICD-10-CM | POA: Diagnosis not present

## 2017-02-17 DIAGNOSIS — R0781 Pleurodynia: Secondary | ICD-10-CM | POA: Diagnosis not present

## 2017-02-17 DIAGNOSIS — I10 Essential (primary) hypertension: Secondary | ICD-10-CM | POA: Diagnosis not present

## 2017-03-09 DIAGNOSIS — M7742 Metatarsalgia, left foot: Secondary | ICD-10-CM | POA: Diagnosis not present

## 2017-03-09 DIAGNOSIS — M7752 Other enthesopathy of left foot: Secondary | ICD-10-CM | POA: Diagnosis not present

## 2017-03-11 DIAGNOSIS — M7752 Other enthesopathy of left foot: Secondary | ICD-10-CM | POA: Diagnosis not present

## 2017-03-23 DIAGNOSIS — F5101 Primary insomnia: Secondary | ICD-10-CM | POA: Diagnosis not present

## 2017-03-23 DIAGNOSIS — R7301 Impaired fasting glucose: Secondary | ICD-10-CM | POA: Diagnosis not present

## 2017-03-23 DIAGNOSIS — Z96643 Presence of artificial hip joint, bilateral: Secondary | ICD-10-CM | POA: Diagnosis not present

## 2017-03-23 DIAGNOSIS — E782 Mixed hyperlipidemia: Secondary | ICD-10-CM | POA: Diagnosis not present

## 2017-03-23 DIAGNOSIS — F17200 Nicotine dependence, unspecified, uncomplicated: Secondary | ICD-10-CM | POA: Diagnosis not present

## 2017-03-23 DIAGNOSIS — J449 Chronic obstructive pulmonary disease, unspecified: Secondary | ICD-10-CM | POA: Diagnosis not present

## 2017-03-23 DIAGNOSIS — I10 Essential (primary) hypertension: Secondary | ICD-10-CM | POA: Diagnosis not present

## 2017-03-24 DIAGNOSIS — M25552 Pain in left hip: Secondary | ICD-10-CM | POA: Diagnosis not present

## 2017-03-24 DIAGNOSIS — M6281 Muscle weakness (generalized): Secondary | ICD-10-CM | POA: Diagnosis not present

## 2017-03-29 DIAGNOSIS — M6281 Muscle weakness (generalized): Secondary | ICD-10-CM | POA: Diagnosis not present

## 2017-03-29 DIAGNOSIS — M25552 Pain in left hip: Secondary | ICD-10-CM | POA: Diagnosis not present

## 2017-04-05 DIAGNOSIS — M25552 Pain in left hip: Secondary | ICD-10-CM | POA: Diagnosis not present

## 2017-04-05 DIAGNOSIS — M6281 Muscle weakness (generalized): Secondary | ICD-10-CM | POA: Diagnosis not present

## 2017-04-08 DIAGNOSIS — G4733 Obstructive sleep apnea (adult) (pediatric): Secondary | ICD-10-CM | POA: Diagnosis not present

## 2017-04-08 DIAGNOSIS — F17210 Nicotine dependence, cigarettes, uncomplicated: Secondary | ICD-10-CM | POA: Diagnosis not present

## 2017-04-08 DIAGNOSIS — J432 Centrilobular emphysema: Secondary | ICD-10-CM | POA: Diagnosis not present

## 2017-04-26 DIAGNOSIS — M6281 Muscle weakness (generalized): Secondary | ICD-10-CM | POA: Diagnosis not present

## 2017-04-26 DIAGNOSIS — M25552 Pain in left hip: Secondary | ICD-10-CM | POA: Diagnosis not present

## 2017-04-29 DIAGNOSIS — E785 Hyperlipidemia, unspecified: Secondary | ICD-10-CM | POA: Diagnosis not present

## 2017-04-29 DIAGNOSIS — G8929 Other chronic pain: Secondary | ICD-10-CM | POA: Diagnosis not present

## 2017-04-29 DIAGNOSIS — Z888 Allergy status to other drugs, medicaments and biological substances status: Secondary | ICD-10-CM | POA: Diagnosis not present

## 2017-04-29 DIAGNOSIS — J45909 Unspecified asthma, uncomplicated: Secondary | ICD-10-CM | POA: Diagnosis not present

## 2017-04-29 DIAGNOSIS — M5442 Lumbago with sciatica, left side: Secondary | ICD-10-CM | POA: Diagnosis not present

## 2017-04-29 DIAGNOSIS — E7800 Pure hypercholesterolemia, unspecified: Secondary | ICD-10-CM | POA: Diagnosis not present

## 2017-04-29 DIAGNOSIS — M5441 Lumbago with sciatica, right side: Secondary | ICD-10-CM | POA: Diagnosis not present

## 2017-04-29 DIAGNOSIS — Z885 Allergy status to narcotic agent status: Secondary | ICD-10-CM | POA: Diagnosis not present

## 2017-04-29 DIAGNOSIS — F17210 Nicotine dependence, cigarettes, uncomplicated: Secondary | ICD-10-CM | POA: Diagnosis not present

## 2017-04-29 DIAGNOSIS — Z79899 Other long term (current) drug therapy: Secondary | ICD-10-CM | POA: Diagnosis not present

## 2017-04-29 DIAGNOSIS — M48061 Spinal stenosis, lumbar region without neurogenic claudication: Secondary | ICD-10-CM | POA: Diagnosis not present

## 2017-04-29 DIAGNOSIS — I10 Essential (primary) hypertension: Secondary | ICD-10-CM | POA: Diagnosis not present

## 2017-04-29 DIAGNOSIS — M5416 Radiculopathy, lumbar region: Secondary | ICD-10-CM | POA: Diagnosis not present

## 2017-04-29 DIAGNOSIS — G894 Chronic pain syndrome: Secondary | ICD-10-CM | POA: Diagnosis not present

## 2017-05-24 DIAGNOSIS — L720 Epidermal cyst: Secondary | ICD-10-CM | POA: Diagnosis not present

## 2017-05-24 DIAGNOSIS — L579 Skin changes due to chronic exposure to nonionizing radiation, unspecified: Secondary | ICD-10-CM | POA: Diagnosis not present

## 2017-05-24 DIAGNOSIS — Z85828 Personal history of other malignant neoplasm of skin: Secondary | ICD-10-CM | POA: Diagnosis not present

## 2017-05-24 DIAGNOSIS — L814 Other melanin hyperpigmentation: Secondary | ICD-10-CM | POA: Diagnosis not present

## 2017-05-24 DIAGNOSIS — D492 Neoplasm of unspecified behavior of bone, soft tissue, and skin: Secondary | ICD-10-CM | POA: Diagnosis not present

## 2017-05-24 DIAGNOSIS — L738 Other specified follicular disorders: Secondary | ICD-10-CM | POA: Diagnosis not present

## 2017-05-24 DIAGNOSIS — L821 Other seborrheic keratosis: Secondary | ICD-10-CM | POA: Diagnosis not present

## 2017-05-24 DIAGNOSIS — L918 Other hypertrophic disorders of the skin: Secondary | ICD-10-CM | POA: Diagnosis not present

## 2017-06-21 DIAGNOSIS — E782 Mixed hyperlipidemia: Secondary | ICD-10-CM | POA: Diagnosis not present

## 2017-06-21 DIAGNOSIS — R7301 Impaired fasting glucose: Secondary | ICD-10-CM | POA: Diagnosis not present

## 2017-06-21 DIAGNOSIS — I10 Essential (primary) hypertension: Secondary | ICD-10-CM | POA: Diagnosis not present

## 2017-06-23 DIAGNOSIS — R7301 Impaired fasting glucose: Secondary | ICD-10-CM | POA: Diagnosis not present

## 2017-06-23 DIAGNOSIS — E782 Mixed hyperlipidemia: Secondary | ICD-10-CM | POA: Diagnosis not present

## 2017-06-23 DIAGNOSIS — F5101 Primary insomnia: Secondary | ICD-10-CM | POA: Diagnosis not present

## 2017-06-23 DIAGNOSIS — J449 Chronic obstructive pulmonary disease, unspecified: Secondary | ICD-10-CM | POA: Diagnosis not present

## 2017-06-23 DIAGNOSIS — F17200 Nicotine dependence, unspecified, uncomplicated: Secondary | ICD-10-CM | POA: Diagnosis not present

## 2017-06-23 DIAGNOSIS — I10 Essential (primary) hypertension: Secondary | ICD-10-CM | POA: Diagnosis not present

## 2017-07-10 DIAGNOSIS — Z79899 Other long term (current) drug therapy: Secondary | ICD-10-CM | POA: Diagnosis not present

## 2017-07-10 DIAGNOSIS — G4733 Obstructive sleep apnea (adult) (pediatric): Secondary | ICD-10-CM | POA: Diagnosis not present

## 2017-07-10 DIAGNOSIS — Z7982 Long term (current) use of aspirin: Secondary | ICD-10-CM | POA: Diagnosis not present

## 2017-07-10 DIAGNOSIS — Z9889 Other specified postprocedural states: Secondary | ICD-10-CM | POA: Diagnosis not present

## 2017-07-10 DIAGNOSIS — Z9989 Dependence on other enabling machines and devices: Secondary | ICD-10-CM | POA: Diagnosis not present

## 2017-07-10 DIAGNOSIS — S62634A Displaced fracture of distal phalanx of right ring finger, initial encounter for closed fracture: Secondary | ICD-10-CM | POA: Diagnosis not present

## 2017-07-10 DIAGNOSIS — S6991XA Unspecified injury of right wrist, hand and finger(s), initial encounter: Secondary | ICD-10-CM | POA: Diagnosis not present

## 2017-07-10 DIAGNOSIS — Z96642 Presence of left artificial hip joint: Secondary | ICD-10-CM | POA: Diagnosis not present

## 2017-07-10 DIAGNOSIS — Z888 Allergy status to other drugs, medicaments and biological substances status: Secondary | ICD-10-CM | POA: Diagnosis not present

## 2017-07-10 DIAGNOSIS — F17200 Nicotine dependence, unspecified, uncomplicated: Secondary | ICD-10-CM | POA: Diagnosis not present

## 2017-07-10 DIAGNOSIS — I10 Essential (primary) hypertension: Secondary | ICD-10-CM | POA: Diagnosis not present

## 2017-07-10 DIAGNOSIS — S62639A Displaced fracture of distal phalanx of unspecified finger, initial encounter for closed fracture: Secondary | ICD-10-CM | POA: Diagnosis not present

## 2017-07-10 DIAGNOSIS — Z885 Allergy status to narcotic agent status: Secondary | ICD-10-CM | POA: Diagnosis not present

## 2017-07-10 DIAGNOSIS — I77811 Abdominal aortic ectasia: Secondary | ICD-10-CM | POA: Diagnosis not present

## 2017-07-10 DIAGNOSIS — H9193 Unspecified hearing loss, bilateral: Secondary | ICD-10-CM | POA: Diagnosis not present

## 2017-07-10 DIAGNOSIS — Z791 Long term (current) use of non-steroidal anti-inflammatories (NSAID): Secondary | ICD-10-CM | POA: Diagnosis not present

## 2017-07-10 DIAGNOSIS — J449 Chronic obstructive pulmonary disease, unspecified: Secondary | ICD-10-CM | POA: Diagnosis not present

## 2017-07-10 DIAGNOSIS — M1990 Unspecified osteoarthritis, unspecified site: Secondary | ICD-10-CM | POA: Diagnosis not present

## 2017-07-12 DIAGNOSIS — S63639A Sprain of interphalangeal joint of unspecified finger, initial encounter: Secondary | ICD-10-CM | POA: Insufficient documentation

## 2017-07-12 DIAGNOSIS — I10 Essential (primary) hypertension: Secondary | ICD-10-CM | POA: Diagnosis not present

## 2017-07-13 DIAGNOSIS — I10 Essential (primary) hypertension: Secondary | ICD-10-CM | POA: Diagnosis not present

## 2017-07-13 DIAGNOSIS — H9193 Unspecified hearing loss, bilateral: Secondary | ICD-10-CM | POA: Diagnosis not present

## 2017-07-13 DIAGNOSIS — E669 Obesity, unspecified: Secondary | ICD-10-CM | POA: Diagnosis not present

## 2017-07-13 DIAGNOSIS — S62634A Displaced fracture of distal phalanx of right ring finger, initial encounter for closed fracture: Secondary | ICD-10-CM | POA: Diagnosis not present

## 2017-07-13 DIAGNOSIS — Z7982 Long term (current) use of aspirin: Secondary | ICD-10-CM | POA: Diagnosis not present

## 2017-07-13 DIAGNOSIS — Z6832 Body mass index (BMI) 32.0-32.9, adult: Secondary | ICD-10-CM | POA: Diagnosis not present

## 2017-07-13 DIAGNOSIS — S63639A Sprain of interphalangeal joint of unspecified finger, initial encounter: Secondary | ICD-10-CM | POA: Diagnosis not present

## 2017-07-13 DIAGNOSIS — Z79899 Other long term (current) drug therapy: Secondary | ICD-10-CM | POA: Diagnosis not present

## 2017-07-13 DIAGNOSIS — Z96642 Presence of left artificial hip joint: Secondary | ICD-10-CM | POA: Diagnosis not present

## 2017-07-13 DIAGNOSIS — E785 Hyperlipidemia, unspecified: Secondary | ICD-10-CM | POA: Diagnosis not present

## 2017-07-13 DIAGNOSIS — J449 Chronic obstructive pulmonary disease, unspecified: Secondary | ICD-10-CM | POA: Diagnosis not present

## 2017-07-13 DIAGNOSIS — S62604A Fracture of unspecified phalanx of right ring finger, initial encounter for closed fracture: Secondary | ICD-10-CM | POA: Diagnosis not present

## 2017-07-13 DIAGNOSIS — M1990 Unspecified osteoarthritis, unspecified site: Secondary | ICD-10-CM | POA: Diagnosis not present

## 2017-07-13 DIAGNOSIS — G4733 Obstructive sleep apnea (adult) (pediatric): Secondary | ICD-10-CM | POA: Diagnosis not present

## 2017-07-13 DIAGNOSIS — F17200 Nicotine dependence, unspecified, uncomplicated: Secondary | ICD-10-CM | POA: Diagnosis not present

## 2017-07-16 DIAGNOSIS — M79641 Pain in right hand: Secondary | ICD-10-CM | POA: Diagnosis not present

## 2017-07-16 DIAGNOSIS — S6981XD Other specified injuries of right wrist, hand and finger(s), subsequent encounter: Secondary | ICD-10-CM | POA: Diagnosis not present

## 2017-07-21 DIAGNOSIS — F17210 Nicotine dependence, cigarettes, uncomplicated: Secondary | ICD-10-CM | POA: Diagnosis not present

## 2017-07-21 DIAGNOSIS — J9611 Chronic respiratory failure with hypoxia: Secondary | ICD-10-CM | POA: Diagnosis not present

## 2017-07-21 DIAGNOSIS — I10 Essential (primary) hypertension: Secondary | ICD-10-CM | POA: Diagnosis not present

## 2017-07-21 DIAGNOSIS — I739 Peripheral vascular disease, unspecified: Secondary | ICD-10-CM | POA: Diagnosis not present

## 2017-07-21 DIAGNOSIS — Z9989 Dependence on other enabling machines and devices: Secondary | ICD-10-CM | POA: Diagnosis not present

## 2017-07-21 DIAGNOSIS — G4733 Obstructive sleep apnea (adult) (pediatric): Secondary | ICD-10-CM | POA: Diagnosis not present

## 2017-07-21 DIAGNOSIS — J432 Centrilobular emphysema: Secondary | ICD-10-CM | POA: Diagnosis not present

## 2017-07-21 DIAGNOSIS — Z716 Tobacco abuse counseling: Secondary | ICD-10-CM | POA: Diagnosis not present

## 2017-07-22 DIAGNOSIS — S6981XD Other specified injuries of right wrist, hand and finger(s), subsequent encounter: Secondary | ICD-10-CM | POA: Diagnosis not present

## 2017-07-22 DIAGNOSIS — M79641 Pain in right hand: Secondary | ICD-10-CM | POA: Diagnosis not present

## 2017-07-28 DIAGNOSIS — S62634A Displaced fracture of distal phalanx of right ring finger, initial encounter for closed fracture: Secondary | ICD-10-CM | POA: Diagnosis not present

## 2017-07-28 DIAGNOSIS — M79644 Pain in right finger(s): Secondary | ICD-10-CM | POA: Diagnosis not present

## 2017-07-28 DIAGNOSIS — Z9889 Other specified postprocedural states: Secondary | ICD-10-CM | POA: Diagnosis not present

## 2017-07-29 DIAGNOSIS — S63639D Sprain of interphalangeal joint of unspecified finger, subsequent encounter: Secondary | ICD-10-CM | POA: Diagnosis not present

## 2017-08-12 DIAGNOSIS — S63639D Sprain of interphalangeal joint of unspecified finger, subsequent encounter: Secondary | ICD-10-CM | POA: Diagnosis not present

## 2017-08-25 DIAGNOSIS — Z9889 Other specified postprocedural states: Secondary | ICD-10-CM | POA: Diagnosis not present

## 2017-08-25 DIAGNOSIS — S62639A Displaced fracture of distal phalanx of unspecified finger, initial encounter for closed fracture: Secondary | ICD-10-CM | POA: Diagnosis not present

## 2017-08-25 DIAGNOSIS — Z8781 Personal history of (healed) traumatic fracture: Secondary | ICD-10-CM | POA: Diagnosis not present

## 2017-08-25 DIAGNOSIS — Z967 Presence of other bone and tendon implants: Secondary | ICD-10-CM | POA: Diagnosis not present

## 2017-08-25 DIAGNOSIS — M7989 Other specified soft tissue disorders: Secondary | ICD-10-CM | POA: Diagnosis not present

## 2017-08-26 DIAGNOSIS — S63639D Sprain of interphalangeal joint of unspecified finger, subsequent encounter: Secondary | ICD-10-CM | POA: Diagnosis not present

## 2017-08-31 DIAGNOSIS — S63639D Sprain of interphalangeal joint of unspecified finger, subsequent encounter: Secondary | ICD-10-CM | POA: Diagnosis not present

## 2017-08-31 DIAGNOSIS — M79641 Pain in right hand: Secondary | ICD-10-CM | POA: Diagnosis not present

## 2017-09-14 DIAGNOSIS — M79641 Pain in right hand: Secondary | ICD-10-CM | POA: Diagnosis not present

## 2017-09-14 DIAGNOSIS — S63639D Sprain of interphalangeal joint of unspecified finger, subsequent encounter: Secondary | ICD-10-CM | POA: Diagnosis not present

## 2017-10-06 DIAGNOSIS — M25551 Pain in right hip: Secondary | ICD-10-CM | POA: Diagnosis not present

## 2017-10-06 DIAGNOSIS — M25552 Pain in left hip: Secondary | ICD-10-CM | POA: Diagnosis not present

## 2017-10-06 DIAGNOSIS — M79644 Pain in right finger(s): Secondary | ICD-10-CM | POA: Diagnosis not present

## 2017-10-06 DIAGNOSIS — M7989 Other specified soft tissue disorders: Secondary | ICD-10-CM | POA: Diagnosis not present

## 2017-10-06 DIAGNOSIS — Z96643 Presence of artificial hip joint, bilateral: Secondary | ICD-10-CM | POA: Diagnosis not present

## 2017-10-19 DIAGNOSIS — M5412 Radiculopathy, cervical region: Secondary | ICD-10-CM | POA: Diagnosis not present

## 2017-10-20 DIAGNOSIS — M542 Cervicalgia: Secondary | ICD-10-CM | POA: Diagnosis not present

## 2017-10-20 DIAGNOSIS — M6281 Muscle weakness (generalized): Secondary | ICD-10-CM | POA: Diagnosis not present

## 2017-10-25 DIAGNOSIS — M25552 Pain in left hip: Secondary | ICD-10-CM | POA: Diagnosis not present

## 2017-10-25 DIAGNOSIS — M6281 Muscle weakness (generalized): Secondary | ICD-10-CM | POA: Diagnosis not present

## 2017-10-25 DIAGNOSIS — M542 Cervicalgia: Secondary | ICD-10-CM | POA: Diagnosis not present

## 2017-11-05 DIAGNOSIS — M542 Cervicalgia: Secondary | ICD-10-CM | POA: Diagnosis not present

## 2017-11-05 DIAGNOSIS — M6281 Muscle weakness (generalized): Secondary | ICD-10-CM | POA: Diagnosis not present

## 2017-11-11 DIAGNOSIS — M542 Cervicalgia: Secondary | ICD-10-CM | POA: Diagnosis not present

## 2017-11-11 DIAGNOSIS — M6281 Muscle weakness (generalized): Secondary | ICD-10-CM | POA: Diagnosis not present

## 2017-11-15 DIAGNOSIS — M6281 Muscle weakness (generalized): Secondary | ICD-10-CM | POA: Diagnosis not present

## 2017-11-15 DIAGNOSIS — M542 Cervicalgia: Secondary | ICD-10-CM | POA: Diagnosis not present

## 2017-11-17 DIAGNOSIS — M6281 Muscle weakness (generalized): Secondary | ICD-10-CM | POA: Diagnosis not present

## 2017-11-17 DIAGNOSIS — M542 Cervicalgia: Secondary | ICD-10-CM | POA: Diagnosis not present

## 2017-11-19 DIAGNOSIS — M542 Cervicalgia: Secondary | ICD-10-CM | POA: Diagnosis not present

## 2017-11-19 DIAGNOSIS — M6281 Muscle weakness (generalized): Secondary | ICD-10-CM | POA: Diagnosis not present

## 2017-11-22 DIAGNOSIS — L821 Other seborrheic keratosis: Secondary | ICD-10-CM | POA: Diagnosis not present

## 2017-11-22 DIAGNOSIS — J441 Chronic obstructive pulmonary disease with (acute) exacerbation: Secondary | ICD-10-CM | POA: Diagnosis not present

## 2017-11-22 DIAGNOSIS — H65111 Acute and subacute allergic otitis media (mucoid) (sanguinous) (serous), right ear: Secondary | ICD-10-CM | POA: Diagnosis not present

## 2017-11-22 DIAGNOSIS — F17210 Nicotine dependence, cigarettes, uncomplicated: Secondary | ICD-10-CM | POA: Diagnosis not present

## 2017-11-22 DIAGNOSIS — L814 Other melanin hyperpigmentation: Secondary | ICD-10-CM | POA: Diagnosis not present

## 2017-11-22 DIAGNOSIS — L579 Skin changes due to chronic exposure to nonionizing radiation, unspecified: Secondary | ICD-10-CM | POA: Diagnosis not present

## 2017-11-22 DIAGNOSIS — Z85828 Personal history of other malignant neoplasm of skin: Secondary | ICD-10-CM | POA: Diagnosis not present

## 2017-11-25 DIAGNOSIS — M6281 Muscle weakness (generalized): Secondary | ICD-10-CM | POA: Diagnosis not present

## 2017-11-25 DIAGNOSIS — M542 Cervicalgia: Secondary | ICD-10-CM | POA: Diagnosis not present

## 2017-12-01 DIAGNOSIS — M542 Cervicalgia: Secondary | ICD-10-CM | POA: Diagnosis not present

## 2017-12-01 DIAGNOSIS — M6281 Muscle weakness (generalized): Secondary | ICD-10-CM | POA: Diagnosis not present

## 2017-12-06 DIAGNOSIS — G894 Chronic pain syndrome: Secondary | ICD-10-CM | POA: Diagnosis not present

## 2017-12-06 DIAGNOSIS — M5416 Radiculopathy, lumbar region: Secondary | ICD-10-CM | POA: Diagnosis not present

## 2017-12-06 DIAGNOSIS — M5442 Lumbago with sciatica, left side: Secondary | ICD-10-CM | POA: Diagnosis not present

## 2017-12-06 DIAGNOSIS — M5441 Lumbago with sciatica, right side: Secondary | ICD-10-CM | POA: Diagnosis not present

## 2017-12-06 DIAGNOSIS — G8929 Other chronic pain: Secondary | ICD-10-CM | POA: Diagnosis not present

## 2017-12-07 DIAGNOSIS — M5416 Radiculopathy, lumbar region: Secondary | ICD-10-CM | POA: Diagnosis not present

## 2017-12-21 DIAGNOSIS — R7301 Impaired fasting glucose: Secondary | ICD-10-CM | POA: Diagnosis not present

## 2017-12-21 DIAGNOSIS — E782 Mixed hyperlipidemia: Secondary | ICD-10-CM | POA: Diagnosis not present

## 2017-12-21 DIAGNOSIS — I10 Essential (primary) hypertension: Secondary | ICD-10-CM | POA: Diagnosis not present

## 2017-12-22 DIAGNOSIS — M542 Cervicalgia: Secondary | ICD-10-CM | POA: Diagnosis not present

## 2017-12-22 DIAGNOSIS — F5101 Primary insomnia: Secondary | ICD-10-CM | POA: Diagnosis not present

## 2017-12-22 DIAGNOSIS — J449 Chronic obstructive pulmonary disease, unspecified: Secondary | ICD-10-CM | POA: Diagnosis not present

## 2017-12-22 DIAGNOSIS — M6281 Muscle weakness (generalized): Secondary | ICD-10-CM | POA: Diagnosis not present

## 2017-12-22 DIAGNOSIS — R7301 Impaired fasting glucose: Secondary | ICD-10-CM | POA: Diagnosis not present

## 2017-12-22 DIAGNOSIS — E782 Mixed hyperlipidemia: Secondary | ICD-10-CM | POA: Diagnosis not present

## 2017-12-22 DIAGNOSIS — I10 Essential (primary) hypertension: Secondary | ICD-10-CM | POA: Diagnosis not present

## 2017-12-29 DIAGNOSIS — M25552 Pain in left hip: Secondary | ICD-10-CM | POA: Diagnosis not present

## 2017-12-29 DIAGNOSIS — M545 Low back pain: Secondary | ICD-10-CM | POA: Diagnosis not present

## 2017-12-29 DIAGNOSIS — M25551 Pain in right hip: Secondary | ICD-10-CM | POA: Diagnosis not present

## 2017-12-29 DIAGNOSIS — M6281 Muscle weakness (generalized): Secondary | ICD-10-CM | POA: Diagnosis not present

## 2018-01-04 DIAGNOSIS — M545 Low back pain: Secondary | ICD-10-CM | POA: Diagnosis not present

## 2018-01-04 DIAGNOSIS — M25551 Pain in right hip: Secondary | ICD-10-CM | POA: Diagnosis not present

## 2018-01-04 DIAGNOSIS — M6281 Muscle weakness (generalized): Secondary | ICD-10-CM | POA: Diagnosis not present

## 2018-01-04 DIAGNOSIS — M25552 Pain in left hip: Secondary | ICD-10-CM | POA: Diagnosis not present

## 2018-01-11 DIAGNOSIS — M25552 Pain in left hip: Secondary | ICD-10-CM | POA: Diagnosis not present

## 2018-01-11 DIAGNOSIS — M25551 Pain in right hip: Secondary | ICD-10-CM | POA: Diagnosis not present

## 2018-01-11 DIAGNOSIS — M6281 Muscle weakness (generalized): Secondary | ICD-10-CM | POA: Diagnosis not present

## 2018-01-11 DIAGNOSIS — M545 Low back pain: Secondary | ICD-10-CM | POA: Diagnosis not present

## 2018-01-12 DIAGNOSIS — M79604 Pain in right leg: Secondary | ICD-10-CM | POA: Diagnosis not present

## 2018-01-12 DIAGNOSIS — M5441 Lumbago with sciatica, right side: Secondary | ICD-10-CM | POA: Diagnosis not present

## 2018-01-12 DIAGNOSIS — G894 Chronic pain syndrome: Secondary | ICD-10-CM | POA: Diagnosis not present

## 2018-01-12 DIAGNOSIS — M79605 Pain in left leg: Secondary | ICD-10-CM | POA: Diagnosis not present

## 2018-01-12 DIAGNOSIS — M5416 Radiculopathy, lumbar region: Secondary | ICD-10-CM | POA: Diagnosis not present

## 2018-01-12 DIAGNOSIS — G8929 Other chronic pain: Secondary | ICD-10-CM | POA: Diagnosis not present

## 2018-01-12 DIAGNOSIS — I10 Essential (primary) hypertension: Secondary | ICD-10-CM | POA: Diagnosis not present

## 2018-01-12 DIAGNOSIS — M5442 Lumbago with sciatica, left side: Secondary | ICD-10-CM | POA: Diagnosis not present

## 2018-01-17 DIAGNOSIS — M25552 Pain in left hip: Secondary | ICD-10-CM | POA: Diagnosis not present

## 2018-01-17 DIAGNOSIS — M6281 Muscle weakness (generalized): Secondary | ICD-10-CM | POA: Diagnosis not present

## 2018-01-17 DIAGNOSIS — M545 Low back pain: Secondary | ICD-10-CM | POA: Diagnosis not present

## 2018-01-17 DIAGNOSIS — M25551 Pain in right hip: Secondary | ICD-10-CM | POA: Diagnosis not present

## 2018-01-18 DIAGNOSIS — Z0000 Encounter for general adult medical examination without abnormal findings: Secondary | ICD-10-CM | POA: Diagnosis not present

## 2018-01-19 DIAGNOSIS — M5416 Radiculopathy, lumbar region: Secondary | ICD-10-CM | POA: Diagnosis not present

## 2018-01-19 DIAGNOSIS — M4726 Other spondylosis with radiculopathy, lumbar region: Secondary | ICD-10-CM | POA: Diagnosis not present

## 2018-01-19 DIAGNOSIS — M4727 Other spondylosis with radiculopathy, lumbosacral region: Secondary | ICD-10-CM | POA: Diagnosis not present

## 2018-01-19 DIAGNOSIS — M5117 Intervertebral disc disorders with radiculopathy, lumbosacral region: Secondary | ICD-10-CM | POA: Diagnosis not present

## 2018-01-19 DIAGNOSIS — M4725 Other spondylosis with radiculopathy, thoracolumbar region: Secondary | ICD-10-CM | POA: Diagnosis not present

## 2018-01-25 ENCOUNTER — Other Ambulatory Visit: Payer: Self-pay | Admitting: Neurological Surgery

## 2018-01-25 DIAGNOSIS — M48062 Spinal stenosis, lumbar region with neurogenic claudication: Secondary | ICD-10-CM | POA: Diagnosis not present

## 2018-01-25 DIAGNOSIS — M4316 Spondylolisthesis, lumbar region: Secondary | ICD-10-CM | POA: Diagnosis not present

## 2018-01-25 DIAGNOSIS — I10 Essential (primary) hypertension: Secondary | ICD-10-CM | POA: Diagnosis not present

## 2018-01-25 DIAGNOSIS — Z6833 Body mass index (BMI) 33.0-33.9, adult: Secondary | ICD-10-CM | POA: Diagnosis not present

## 2018-01-26 DIAGNOSIS — M545 Low back pain: Secondary | ICD-10-CM | POA: Diagnosis not present

## 2018-01-26 DIAGNOSIS — M25552 Pain in left hip: Secondary | ICD-10-CM | POA: Diagnosis not present

## 2018-01-26 DIAGNOSIS — M25551 Pain in right hip: Secondary | ICD-10-CM | POA: Diagnosis not present

## 2018-01-26 DIAGNOSIS — M6281 Muscle weakness (generalized): Secondary | ICD-10-CM | POA: Diagnosis not present

## 2018-01-27 DIAGNOSIS — M6281 Muscle weakness (generalized): Secondary | ICD-10-CM | POA: Diagnosis not present

## 2018-01-27 DIAGNOSIS — M545 Low back pain: Secondary | ICD-10-CM | POA: Diagnosis not present

## 2018-01-27 DIAGNOSIS — M25552 Pain in left hip: Secondary | ICD-10-CM | POA: Diagnosis not present

## 2018-01-27 DIAGNOSIS — M25551 Pain in right hip: Secondary | ICD-10-CM | POA: Diagnosis not present

## 2018-02-01 DIAGNOSIS — M545 Low back pain: Secondary | ICD-10-CM | POA: Diagnosis not present

## 2018-02-01 DIAGNOSIS — M6281 Muscle weakness (generalized): Secondary | ICD-10-CM | POA: Diagnosis not present

## 2018-02-01 DIAGNOSIS — M25551 Pain in right hip: Secondary | ICD-10-CM | POA: Diagnosis not present

## 2018-02-01 DIAGNOSIS — M25552 Pain in left hip: Secondary | ICD-10-CM | POA: Diagnosis not present

## 2018-02-09 NOTE — Pre-Procedure Instructions (Signed)
[name]  02/09/2018      [facility] - [place], [place] - [address] AT [facility] [address] [place] [address] Phone: [phone] Fax: [phone]    Your procedure is scheduled on May 23  Report to [place] at [place].M.  Call this number if you have problems the morning of surgery:  [phone]   Remember:  Do not eat food or drink liquids after midnight.   Continue all medications as directed by your physician except follow these medication instructions before surgery below   Take these medicines the morning of surgery with A SIP OF WATER  acetaminophen (TYLENOL)  gabapentin (NEURONTIN)    7 days prior to surgery STOP taking any Aspirin(unless otherwise instructed by your surgeon), Aleve, Naproxen, Ibuprofen, Motrin, Advil, Goody's, BC's, all herbal medications, fish oil, and all vitamins    Do not wear jewelry  Do not wear lotions, powders, or cologne, or deodorant.  Men may shave face and neck.  Do not bring valuables to the hospital.  [facility] is not responsible for any belongings or valuables.  Contacts, dentures or bridgework may not be worn into surgery.  Leave your suitcase in the car.  After surgery it may be brought to your room.  For patients admitted to the hospital, discharge time will be determined by your treatment team.  Patients discharged the day of surgery will not be allowed to drive home.    Special instructions:   [place]- Preparing For Surgery  Before surgery, you can play an important role. Because skin is not sterile, your skin needs to be as free of germs as possible. You can reduce the number of germs on your skin by washing with CHG (chlorahexidine gluconate) Soap before surgery.  CHG is an antiseptic cleaner which kills germs and bonds with the skin to continue killing germs even after washing.  Oral Hygiene is also important to reduce your risk of infection.   Remember - BRUSH YOUR TEETH THE MORNING OF SURGERY  Please do not use if you have an allergy to CHG or antibacterial soaps. If your skin becomes reddened/irritated stop using the CHG.  Do not shave (including legs and underarms) for at least 48 hours prior to first CHG shower. It is OK to shave your face.  Please follow these instructions carefully.   1. Shower the NIGHT BEFORE SURGERY and the MORNING OF SURGERY with CHG.   2. If you chose to wash your hair, wash your hair first as usual with your normal shampoo.  3. After you shampoo, rinse your hair and body thoroughly to remove the shampoo.  4. Use CHG as you would any other liquid soap. You can apply CHG directly to the skin and wash gently with a scrungie or a clean washcloth.   5. Apply the CHG Soap to your body ONLY FROM THE NECK DOWN.  Do not use on open wounds or open sores. Avoid contact with your eyes, ears, mouth and genitals (private parts). Wash Face and genitals (private parts)  with your normal soap.  6. Wash thoroughly, paying special attention to the area where your surgery will be performed.  7. Thoroughly rinse your body with warm water from the neck down.  8. DO NOT shower/wash with your normal soap after using and rinsing off the CHG Soap.  9. Pat yourself dry with a CLEAN TOWEL.  10. Wear CLEAN  PAJAMAS to bed the night before surgery, wear comfortable clothes the morning of surgery  11. Place CLEAN SHEETS on your bed the night of your first shower and DO NOT SLEEP WITH PETS.    Day of Surgery:  Do not apply any deodorants/lotions.  Please wear clean clothes to the hospital/surgery center.   Remember to brush your teeth.      Please read over the following fact sheets that you were given.

## 2018-02-10 ENCOUNTER — Ambulatory Visit (HOSPITAL_COMMUNITY): Admission: RE | Admit: 2018-02-10 | Payer: Medicare Other | Source: Ambulatory Visit

## 2018-02-10 ENCOUNTER — Encounter (HOSPITAL_COMMUNITY)
Admission: RE | Admit: 2018-02-10 | Discharge: 2018-02-10 | Disposition: A | Discharge status: Home / Self Care | Payer: Medicare Other | Source: Ambulatory Visit | Attending: Neurology | Admitting: Neurology

## 2018-02-10 ENCOUNTER — Encounter (HOSPITAL_COMMUNITY): Payer: Self-pay

## 2018-02-10 ENCOUNTER — Other Ambulatory Visit: Payer: Self-pay

## 2018-02-10 DIAGNOSIS — F17210 Nicotine dependence, cigarettes, uncomplicated: Secondary | ICD-10-CM | POA: Insufficient documentation

## 2018-02-10 DIAGNOSIS — J449 Chronic obstructive pulmonary disease, unspecified: Secondary | ICD-10-CM | POA: Insufficient documentation

## 2018-02-10 DIAGNOSIS — G4730 Sleep apnea, unspecified: Secondary | ICD-10-CM | POA: Diagnosis not present

## 2018-02-10 DIAGNOSIS — Z01818 Encounter for other preprocedural examination: Secondary | ICD-10-CM | POA: Diagnosis not present

## 2018-02-10 DIAGNOSIS — Z79899 Other long term (current) drug therapy: Secondary | ICD-10-CM | POA: Insufficient documentation

## 2018-02-10 DIAGNOSIS — I10 Essential (primary) hypertension: Secondary | ICD-10-CM | POA: Insufficient documentation

## 2018-02-10 DIAGNOSIS — M48061 Spinal stenosis, lumbar region without neurogenic claudication: Secondary | ICD-10-CM | POA: Insufficient documentation

## 2018-02-10 HISTORY — DX: Essential (primary) hypertension: I10

## 2018-02-10 HISTORY — DX: Sleep apnea, unspecified: G47.30

## 2018-02-10 HISTORY — DX: Spinal stenosis, lumbar region without neurogenic claudication: M48.061

## 2018-02-10 HISTORY — DX: Chronic obstructive pulmonary disease, unspecified: J44.9

## 2018-02-10 LAB — CBC WITH DIFFERENTIAL/PLATELET
Abs Immature Granulocytes: 0 10*3/uL (ref 0.0–0.1)
BASOS ABS: 0 10*3/uL (ref 0.0–0.1)
BASOS PCT: 1 %
EOS ABS: 0.3 10*3/uL (ref 0.0–0.7)
Eosinophils Relative: 4 %
HEMATOCRIT: 47.3 % (ref 39.0–52.0)
Hemoglobin: 16.5 g/dL (ref 13.0–17.0)
IMMATURE GRANULOCYTES: 1 %
Lymphocytes Relative: 26 %
Lymphs Abs: 1.9 10*3/uL (ref 0.7–4.0)
MCH: 32.5 pg (ref 26.0–34.0)
MCHC: 34.9 g/dL (ref 30.0–36.0)
MCV: 93.3 fL (ref 78.0–100.0)
MONO ABS: 0.7 10*3/uL (ref 0.1–1.0)
MONOS PCT: 9 %
NEUTROS ABS: 4.4 10*3/uL (ref 1.7–7.7)
Neutrophils Relative %: 59 %
PLATELETS: 186 10*3/uL (ref 150–400)
RBC: 5.07 MIL/uL (ref 4.22–5.81)
RDW: 13.3 % (ref 11.5–15.5)
WBC: 7.3 10*3/uL (ref 4.0–10.5)

## 2018-02-10 LAB — BASIC METABOLIC PANEL
Anion gap: 11 (ref 5–15)
BUN: 9 mg/dL (ref 6–20)
CALCIUM: 9.3 mg/dL (ref 8.9–10.3)
CO2: 31 mmol/L (ref 22–32)
CREATININE: 0.8 mg/dL (ref 0.61–1.24)
Chloride: 96 mmol/L — ABNORMAL LOW (ref 101–111)
GFR calc Af Amer: >60 mL/min (ref >60)
GFR calc non Af Amer: >60 mL/min (ref >60)
GLUCOSE: 109 mg/dL — AB (ref 65–99)
Potassium: 3.7 mmol/L (ref 3.5–5.1)
Sodium: 138 mmol/L (ref 135–145)

## 2018-02-10 LAB — SURGICAL PCR SCREEN
MRSA, PCR: NEGATIVE
Staphylococcus aureus: NEGATIVE

## 2018-02-10 LAB — PROTIME-INR
INR: 1.02
PROTHROMBIN TIME: 13.3 s (ref 11.4–15.2)

## 2018-02-10 NOTE — Progress Notes (Addendum)
PCP - Dr. Julie Martinique- [place]  Cardiologist - Denies  Chest x-ray - 02/17/18  EKG - 02/10/18  Stress Test - Denies  ECHO - Denies  Cardiac Cath - Denies  Sleep Study - Yes- Positive CPAP - Yes- Told to bring mask DOS  LABS- 02/10/18: CBC w/D, BMP, PT  ASA- Denies  Anesthesia- No  Pt denies having chest pain, sob, or fever at this time. All instructions explained to the pt, with a verbal understanding of the material. Pt agrees to go over the instructions while at home for a better understanding. The opportunity to ask questions was provided.

## 2018-02-16 MED ORDER — DEXTROSE 5 % IV SOLN
3.0000 g | INTRAVENOUS | Status: AC
  Administered 2018-02-17: 3 g via INTRAVENOUS
  Filled 2018-02-16: qty 3

## 2018-02-17 ENCOUNTER — Observation Stay (HOSPITAL_COMMUNITY)
Admission: RE | Admit: 2018-02-17 | Discharge: 2018-02-17 | Disposition: A | Discharge status: Home / Self Care | Payer: Medicare Other | Source: Ambulatory Visit | Attending: Neurological Surgery | Admitting: Neurological Surgery

## 2018-02-17 ENCOUNTER — Inpatient Hospital Stay (HOSPITAL_COMMUNITY): Payer: Medicare Other | Admitting: Certified Registered"

## 2018-02-17 ENCOUNTER — Encounter (HOSPITAL_COMMUNITY): Payer: Self-pay | Admitting: *Deleted

## 2018-02-17 ENCOUNTER — Inpatient Hospital Stay (HOSPITAL_COMMUNITY): Payer: Medicare Other

## 2018-02-17 ENCOUNTER — Encounter (HOSPITAL_COMMUNITY)
Admission: RE | Disposition: A | Discharge status: Home / Self Care | Payer: Self-pay | Source: Ambulatory Visit | Attending: Neurological Surgery

## 2018-02-17 DIAGNOSIS — J449 Chronic obstructive pulmonary disease, unspecified: Secondary | ICD-10-CM | POA: Insufficient documentation

## 2018-02-17 DIAGNOSIS — I10 Essential (primary) hypertension: Secondary | ICD-10-CM | POA: Diagnosis not present

## 2018-02-17 DIAGNOSIS — Z981 Arthrodesis status: Secondary | ICD-10-CM | POA: Diagnosis not present

## 2018-02-17 DIAGNOSIS — Z96643 Presence of artificial hip joint, bilateral: Secondary | ICD-10-CM | POA: Diagnosis not present

## 2018-02-17 DIAGNOSIS — Z9889 Other specified postprocedural states: Secondary | ICD-10-CM

## 2018-02-17 DIAGNOSIS — G4730 Sleep apnea, unspecified: Secondary | ICD-10-CM | POA: Insufficient documentation

## 2018-02-17 DIAGNOSIS — Z01818 Encounter for other preprocedural examination: Secondary | ICD-10-CM

## 2018-02-17 DIAGNOSIS — F17210 Nicotine dependence, cigarettes, uncomplicated: Secondary | ICD-10-CM | POA: Insufficient documentation

## 2018-02-17 DIAGNOSIS — M5126 Other intervertebral disc displacement, lumbar region: Secondary | ICD-10-CM | POA: Diagnosis not present

## 2018-02-17 DIAGNOSIS — Z79899 Other long term (current) drug therapy: Secondary | ICD-10-CM | POA: Insufficient documentation

## 2018-02-17 DIAGNOSIS — M48061 Spinal stenosis, lumbar region without neurogenic claudication: Secondary | ICD-10-CM | POA: Diagnosis not present

## 2018-02-17 DIAGNOSIS — Z419 Encounter for procedure for purposes other than remedying health state, unspecified: Secondary | ICD-10-CM

## 2018-02-17 HISTORY — PX: LUMBAR LAMINECTOMY/DECOMPRESSION MICRODISCECTOMY: SHX5026

## 2018-02-17 SURGERY — LUMBAR LAMINECTOMY/DECOMPRESSION MICRODISCECTOMY 3 LEVELS
Anesthesia: General | Site: Spine Lumbar

## 2018-02-17 MED ORDER — GABAPENTIN 300 MG PO CAPS: 300.0000 mg | ORAL_CAPSULE | Freq: Three times a day (TID) | ORAL | Status: DC

## 2018-02-17 MED ORDER — HYDROCODONE-ACETAMINOPHEN 5-325 MG PO TABS: 1.0000 | ORAL_TABLET | ORAL | 0 refills | Status: DC | PRN

## 2018-02-17 MED ORDER — ALBUTEROL SULFATE HFA 108 (90 BASE) MCG/ACT IN AERS
INHALATION_SPRAY | RESPIRATORY_TRACT | Status: DC | PRN
  Administered 2018-02-17 (×2): 3 via RESPIRATORY_TRACT

## 2018-02-17 MED ORDER — ONDANSETRON HCL 4 MG/2ML IJ SOLN: 4.0000 mg | Freq: Four times a day (QID) | INTRAMUSCULAR | Status: DC | PRN

## 2018-02-17 MED ORDER — THROMBIN (RECOMBINANT) 5000 UNITS EX SOLR
OROMUCOSAL | Status: DC | PRN
  Administered 2018-02-17: 09:00:00 via TOPICAL

## 2018-02-17 MED ORDER — CEFAZOLIN SODIUM-DEXTROSE 2-4 GM/100ML-% IV SOLN: 2.0000 g | Freq: Three times a day (TID) | INTRAVENOUS | Status: DC

## 2018-02-17 MED ORDER — ONDANSETRON HCL 4 MG/2ML IJ SOLN: 4.0000 mg | Freq: Once | INTRAMUSCULAR | Status: DC | PRN

## 2018-02-17 MED ORDER — BUPIVACAINE HCL (PF) 0.25 % IJ SOLN
INTRAMUSCULAR | Status: AC
  Filled 2018-02-17: qty 30

## 2018-02-17 MED ORDER — HEMOSTATIC AGENTS (NO CHARGE) OPTIME
TOPICAL | Status: DC | PRN
  Administered 2018-02-17: 1 via TOPICAL

## 2018-02-17 MED ORDER — FENTANYL CITRATE (PF) 100 MCG/2ML IJ SOLN
INTRAMUSCULAR | Status: DC | PRN
  Administered 2018-02-17 (×3): 25 ug via INTRAVENOUS
  Administered 2018-02-17: 150 ug via INTRAVENOUS
  Administered 2018-02-17: 25 ug via INTRAVENOUS

## 2018-02-17 MED ORDER — CELECOXIB 200 MG PO CAPS
200.0000 mg | ORAL_CAPSULE | Freq: Two times a day (BID) | ORAL | Status: DC
  Administered 2018-02-17: 200 mg via ORAL
  Filled 2018-02-17: qty 1

## 2018-02-17 MED ORDER — MENTHOL 3 MG MT LOZG: 1.0000 | LOZENGE | OROMUCOSAL | Status: DC | PRN

## 2018-02-17 MED ORDER — CHLORHEXIDINE GLUCONATE CLOTH 2 % EX PADS: 6.0000 | MEDICATED_PAD | Freq: Once | CUTANEOUS | Status: DC

## 2018-02-17 MED ORDER — 0.9 % SODIUM CHLORIDE (POUR BTL) OPTIME
TOPICAL | Status: DC | PRN
  Administered 2018-02-17: 1000 mL

## 2018-02-17 MED ORDER — LACTATED RINGERS IV SOLN
INTRAVENOUS | Status: DC | PRN
  Administered 2018-02-17: 07:00:00 via INTRAVENOUS

## 2018-02-17 MED ORDER — ONDANSETRON HCL 4 MG PO TABS: 4.0000 mg | ORAL_TABLET | Freq: Four times a day (QID) | ORAL | Status: DC | PRN

## 2018-02-17 MED ORDER — SODIUM CHLORIDE 0.9% FLUSH: 3.0000 mL | INTRAVENOUS | Status: DC | PRN

## 2018-02-17 MED ORDER — METHOCARBAMOL 500 MG PO TABS
500.0000 mg | ORAL_TABLET | Freq: Four times a day (QID) | ORAL | Status: DC | PRN
  Administered 2018-02-17: 500 mg via ORAL

## 2018-02-17 MED ORDER — LIDOCAINE HCL (CARDIAC) PF 100 MG/5ML IV SOSY
PREFILLED_SYRINGE | INTRAVENOUS | Status: DC | PRN
  Administered 2018-02-17: 60 mg via INTRAVENOUS

## 2018-02-17 MED ORDER — PHENOL 1.4 % MT LIQD: 1.0000 | OROMUCOSAL | Status: DC | PRN

## 2018-02-17 MED ORDER — MIDAZOLAM HCL 5 MG/5ML IJ SOLN
INTRAMUSCULAR | Status: DC | PRN
  Administered 2018-02-17: 2 mg via INTRAVENOUS

## 2018-02-17 MED ORDER — ACETAMINOPHEN 650 MG RE SUPP: 650.0000 mg | RECTAL | Status: DC | PRN

## 2018-02-17 MED ORDER — DEXAMETHASONE SODIUM PHOSPHATE 10 MG/ML IJ SOLN
10.0000 mg | INTRAMUSCULAR | Status: AC
  Administered 2018-02-17: 10 mg via INTRAVENOUS
  Filled 2018-02-17: qty 1

## 2018-02-17 MED ORDER — HYDROCHLOROTHIAZIDE 25 MG PO TABS
25.0000 mg | ORAL_TABLET | Freq: Every day | ORAL | Status: DC
  Administered 2018-02-17: 25 mg via ORAL
  Filled 2018-02-17: qty 1

## 2018-02-17 MED ORDER — FENTANYL CITRATE (PF) 100 MCG/2ML IJ SOLN
INTRAMUSCULAR | Status: AC
  Filled 2018-02-17: qty 2

## 2018-02-17 MED ORDER — SODIUM CHLORIDE 0.9 % IV SOLN: 250.0000 mL | INTRAVENOUS | Status: DC

## 2018-02-17 MED ORDER — FENTANYL CITRATE (PF) 250 MCG/5ML IJ SOLN
INTRAMUSCULAR | Status: AC
  Filled 2018-02-17: qty 5

## 2018-02-17 MED ORDER — PROPOFOL 10 MG/ML IV BOLUS
INTRAVENOUS | Status: DC | PRN
  Administered 2018-02-17: 180 mg via INTRAVENOUS

## 2018-02-17 MED ORDER — OXYCODONE HCL 5 MG PO TABS
5.0000 mg | ORAL_TABLET | ORAL | Status: DC | PRN
  Administered 2018-02-17 (×3): 5 mg via ORAL
  Filled 2018-02-17 (×2): qty 1

## 2018-02-17 MED ORDER — PROPOFOL 10 MG/ML IV BOLUS
INTRAVENOUS | Status: AC
  Filled 2018-02-17: qty 40

## 2018-02-17 MED ORDER — METHOCARBAMOL 500 MG PO TABS: 500.0000 mg | ORAL_TABLET | Freq: Four times a day (QID) | ORAL | 0 refills | Status: DC | PRN

## 2018-02-17 MED ORDER — THROMBIN 5000 UNITS EX SOLR
CUTANEOUS | Status: AC
  Filled 2018-02-17: qty 5000

## 2018-02-17 MED ORDER — OXYCODONE HCL 5 MG PO TABS
ORAL_TABLET | ORAL | Status: AC
  Filled 2018-02-17: qty 1

## 2018-02-17 MED ORDER — LOSARTAN POTASSIUM 50 MG PO TABS
50.0000 mg | ORAL_TABLET | Freq: Every day | ORAL | Status: DC
  Administered 2018-02-17: 50 mg via ORAL
  Filled 2018-02-17: qty 1

## 2018-02-17 MED ORDER — THROMBIN 20000 UNITS EX SOLR
CUTANEOUS | Status: AC
  Filled 2018-02-17: qty 20000

## 2018-02-17 MED ORDER — SUGAMMADEX SODIUM 200 MG/2ML IV SOLN
INTRAVENOUS | Status: DC | PRN
  Administered 2018-02-17: 200 mg via INTRAVENOUS

## 2018-02-17 MED ORDER — SODIUM CHLORIDE 0.9% FLUSH: 3.0000 mL | Freq: Two times a day (BID) | INTRAVENOUS | Status: DC

## 2018-02-17 MED ORDER — POTASSIUM CHLORIDE IN NACL 20-0.9 MEQ/L-% IV SOLN: INTRAVENOUS | Status: DC

## 2018-02-17 MED ORDER — ROCURONIUM BROMIDE 100 MG/10ML IV SOLN
INTRAVENOUS | Status: DC | PRN
  Administered 2018-02-17: 50 mg via INTRAVENOUS
  Administered 2018-02-17 (×2): 10 mg via INTRAVENOUS

## 2018-02-17 MED ORDER — SENNA 8.6 MG PO TABS
1.0000 | ORAL_TABLET | Freq: Two times a day (BID) | ORAL | Status: DC
  Filled 2018-02-17: qty 1

## 2018-02-17 MED ORDER — METHOCARBAMOL 1000 MG/10ML IJ SOLN: 500.0000 mg | Freq: Four times a day (QID) | INTRAVENOUS | Status: DC | PRN

## 2018-02-17 MED ORDER — ACETAMINOPHEN 325 MG PO TABS
650.0000 mg | ORAL_TABLET | ORAL | Status: DC | PRN
Start: 2018-02-17 — End: 2018-02-17

## 2018-02-17 MED ORDER — FENTANYL CITRATE (PF) 100 MCG/2ML IJ SOLN
25.0000 ug | INTRAMUSCULAR | Status: DC | PRN
  Administered 2018-02-17 (×2): 25 ug via INTRAVENOUS
  Administered 2018-02-17: 50 ug via INTRAVENOUS

## 2018-02-17 MED ORDER — BUPIVACAINE HCL (PF) 0.25 % IJ SOLN
INTRAMUSCULAR | Status: DC | PRN
  Administered 2018-02-17: 6 mL

## 2018-02-17 MED ORDER — SODIUM CHLORIDE 0.9 % IV SOLN
INTRAVENOUS | Status: DC | PRN
  Administered 2018-02-17: 09:00:00

## 2018-02-17 MED ORDER — ONDANSETRON HCL 4 MG/2ML IJ SOLN
INTRAMUSCULAR | Status: DC | PRN
  Administered 2018-02-17: 4 mg via INTRAVENOUS

## 2018-02-17 MED ORDER — METHOCARBAMOL 500 MG PO TABS
ORAL_TABLET | ORAL | Status: AC
  Filled 2018-02-17: qty 1

## 2018-02-17 MED ORDER — MIDAZOLAM HCL 2 MG/2ML IJ SOLN
INTRAMUSCULAR | Status: AC
  Filled 2018-02-17: qty 2

## 2018-02-17 MED ORDER — HYDROMORPHONE HCL 1 MG/ML IJ SOLN: 0.5000 mg | INTRAMUSCULAR | Status: DC | PRN

## 2018-02-17 SURGICAL SUPPLY — 49 items
APL SKNCLS STERI-STRIP NONHPOA (GAUZE/BANDAGES/DRESSINGS) ×1
BAG DECANTER FOR FLEXI CONT (MISCELLANEOUS) ×2 IMPLANT
BENZOIN TINCTURE PRP APPL 2/3 (GAUZE/BANDAGES/DRESSINGS) ×2 IMPLANT
BUR MATCHSTICK NEURO 3.0 LAGG (BURR) ×2 IMPLANT
CANISTER SUCT 3000ML PPV (MISCELLANEOUS) ×2 IMPLANT
CARTRIDGE OIL MAESTRO DRILL (MISCELLANEOUS) ×1 IMPLANT
DIFFUSER DRILL AIR PNEUMATIC (MISCELLANEOUS) ×2 IMPLANT
DRAPE LAPAROTOMY 100X72X124 (DRAPES) ×2 IMPLANT
DRAPE MICROSCOPE LEICA (MISCELLANEOUS) ×1 IMPLANT
DRAPE SURG 17X23 STRL (DRAPES) ×2 IMPLANT
DRSG OPSITE POSTOP 4X6 (GAUZE/BANDAGES/DRESSINGS) ×1 IMPLANT
DURAPREP 26ML APPLICATOR (WOUND CARE) ×2 IMPLANT
ELECT REM PT RETURN 9FT ADLT (ELECTROSURGICAL) ×2
ELECTRODE REM PT RTRN 9FT ADLT (ELECTROSURGICAL) ×1 IMPLANT
GAUZE SPONGE 4X4 16PLY XRAY LF (GAUZE/BANDAGES/DRESSINGS) IMPLANT
GLOVE BIO SURGEON STRL SZ7 (GLOVE) ×1 IMPLANT
GLOVE BIO SURGEON STRL SZ8 (GLOVE) ×2 IMPLANT
GLOVE BIOGEL PI IND STRL 7.0 (GLOVE) IMPLANT
GLOVE BIOGEL PI IND STRL 7.5 (GLOVE) IMPLANT
GLOVE BIOGEL PI IND STRL 8 (GLOVE) IMPLANT
GLOVE BIOGEL PI INDICATOR 7.0 (GLOVE) ×3
GLOVE BIOGEL PI INDICATOR 7.5 (GLOVE) ×2
GLOVE BIOGEL PI INDICATOR 8 (GLOVE) ×1
GLOVE ECLIPSE 7.5 STRL STRAW (GLOVE) ×1 IMPLANT
GOWN STRL REUS W/ TWL LRG LVL3 (GOWN DISPOSABLE) IMPLANT
GOWN STRL REUS W/ TWL XL LVL3 (GOWN DISPOSABLE) ×1 IMPLANT
GOWN STRL REUS W/TWL LRG LVL3 (GOWN DISPOSABLE) ×4
GOWN STRL REUS W/TWL XL LVL3 (GOWN DISPOSABLE) ×8
HEMOSTAT POWDER KIT SURGIFOAM (HEMOSTASIS) ×1 IMPLANT
KIT BASIN OR (CUSTOM PROCEDURE TRAY) ×2 IMPLANT
KIT TURNOVER KIT B (KITS) ×2 IMPLANT
NDL HYPO 25X1 1.5 SAFETY (NEEDLE) ×1 IMPLANT
NDL SPNL 20GX3.5 QUINCKE YW (NEEDLE) IMPLANT
NEEDLE HYPO 25X1 1.5 SAFETY (NEEDLE) ×2 IMPLANT
NEEDLE SPNL 20GX3.5 QUINCKE YW (NEEDLE) ×2 IMPLANT
NS IRRIG 1000ML POUR BTL (IV SOLUTION) ×2 IMPLANT
OIL CARTRIDGE MAESTRO DRILL (MISCELLANEOUS) ×2
PACK LAMINECTOMY NEURO (CUSTOM PROCEDURE TRAY) ×2 IMPLANT
PAD ARMBOARD 7.5X6 YLW CONV (MISCELLANEOUS) ×6 IMPLANT
RUBBERBAND STERILE (MISCELLANEOUS) ×2 IMPLANT
SPONGE SURGIFOAM ABS GEL SZ50 (HEMOSTASIS) ×1 IMPLANT
STRIP CLOSURE SKIN 1/2X4 (GAUZE/BANDAGES/DRESSINGS) ×2 IMPLANT
SUT VIC AB 0 CT1 18XCR BRD8 (SUTURE) ×1 IMPLANT
SUT VIC AB 0 CT1 8-18 (SUTURE) ×2
SUT VIC AB 2-0 CP2 18 (SUTURE) ×2 IMPLANT
SUT VIC AB 3-0 SH 8-18 (SUTURE) ×3 IMPLANT
TOWEL GREEN STERILE (TOWEL DISPOSABLE) ×2 IMPLANT
TOWEL GREEN STERILE FF (TOWEL DISPOSABLE) ×2 IMPLANT
WATER STERILE IRR 1000ML POUR (IV SOLUTION) ×2 IMPLANT

## 2018-02-17 NOTE — Progress Notes (Signed)
Pt doing well. Pt and wife given D/C instructions with Rx's, verbal understanding was provided. Pt's incision is covered with Honeycomb dressing with no sign of infection. Pt's IV was removed prior to D/C. Pt D/C'd home via wheelchair per MD order. Pt is stable @ D/C and has no other needs at this time. Holli Humbles, RN

## 2018-02-17 NOTE — Anesthesia Procedure Notes (Signed)
Procedure Name: Intubation Date/Time: 02/17/2018 7:49 AM Performed by: Cleda Daub, CRNA Pre-anesthesia Checklist: Patient identified, Emergency Drugs available, Suction available and Patient being monitored Patient Re-evaluated:Patient Re-evaluated prior to induction Oxygen Delivery Method: Circle system utilized Preoxygenation: Pre-oxygenation with 100% oxygen Induction Type: IV induction Ventilation: Mask ventilation without difficulty, Mask ventilation throughout procedure and Oral airway inserted - appropriate to patient size Laryngoscope Size: Mac and 3 Grade View: Grade I Tube type: Oral Tube size: 8.0 mm Number of attempts: 1 Airway Equipment and Method: Stylet Placement Confirmation: ETT inserted through vocal cords under direct vision,  positive ETCO2 and breath sounds checked- equal and bilateral Secured at: 23 cm Tube secured with: Tape Dental Injury: Teeth and Oropharynx as per pre-operative assessment

## 2018-02-17 NOTE — Discharge Summary (Signed)
Physician Discharge Summary  Patient ID: [name] MRN: [id number] DOB/AGE: [birth year] 67 y.o.  Admit date: 02/17/2018 Discharge date: 02/17/2018  Admission Diagnoses: Lumbar spinal stenosis L2-3 L3-4 L4-5 with back and bilateral leg pain   Discharge Diagnoses: same   Discharged Condition: good  Hospital Course: The patient was admitted on 02/17/2018 and taken to the operating room where the patient underwent decompression L2-3,3-4,4-5. The patient tolerated the procedure well and was taken to the recovery room and then to the floor in stable condition. The hospital course was routine. There were no complications. The wound remained clean dry and intact. Pt had appropriate back soreness. No complaints of leg pain or new N/T/W. The patient remained afebrile with stable vital signs, and tolerated a regular diet. The patient continued to increase activities, and pain was well controlled with oral pain medications.   Consults: None  Significant Diagnostic Studies:  Results for orders placed or performed during the hospital encounter of 02/10/18  Surgical pcr screen  Result Value Ref Range   MRSA, PCR NEGATIVE NEGATIVE   Staphylococcus aureus NEGATIVE NEGATIVE  Basic metabolic panel  Result Value Ref Range   Sodium 138 135 - 145 mmol/L   Potassium 3.7 3.5 - 5.1 mmol/L   Chloride 96 (L) 101 - 111 mmol/L   CO2 31 22 - 32 mmol/L   Glucose, Bld 109 (H) 65 - 99 mg/dL   BUN 9 6 - 20 mg/dL   Creatinine, Ser 0.80 0.61 - 1.24 mg/dL   Calcium 9.3 8.9 - 10.3 mg/dL   GFR calc non Af Amer >60 >60 mL/min   GFR calc Af Amer >60 >60 mL/min   Anion gap 11 5 - 15  CBC WITH DIFFERENTIAL  Result Value Ref Range   WBC 7.3 4.0 - 10.5 K/uL   RBC 5.07 4.22 - 5.81 MIL/uL   Hemoglobin 16.5 13.0 - 17.0 g/dL   HCT 47.3 39.0 - 52.0 %   MCV 93.3 78.0 - 100.0 fL   MCH 32.5 26.0 - 34.0 pg   MCHC 34.9 30.0 - 36.0 g/dL   RDW 13.3 11.5 - 15.5 %   Platelets 186 150 - 400 K/uL   Neutrophils Relative % 59 %    Neutro Abs 4.4 1.7 - 7.7 K/uL   Lymphocytes Relative 26 %   Lymphs Abs 1.9 0.7 - 4.0 K/uL   Monocytes Relative 9 %   Monocytes Absolute 0.7 0.1 - 1.0 K/uL   Eosinophils Relative 4 %   Eosinophils Absolute 0.3 0.0 - 0.7 K/uL   Basophils Relative 1 %   Basophils Absolute 0.0 0.0 - 0.1 K/uL   Immature Granulocytes 1 %   Abs Immature Granulocytes 0.0 0.0 - 0.1 K/uL  Protime-INR  Result Value Ref Range   Prothrombin Time 13.3 11.4 - 15.2 seconds   INR 1.02     Dg Chest 2 View  Result Date: 02/17/2018 CLINICAL DATA:  Preoperative evaluation. Lumbar spine surgery. History of COPD. EXAM: CHEST - 2 VIEW COMPARISON:  Chest radiograph October 07, 2006 FINDINGS: Cardiomediastinal silhouette is normal. Increased lung volumes with flattened hemidiaphragms. Minimal LEFT lung base pleural thickening and scarring. No pleural effusion or focal consolidation. No pneumothorax. Mild degenerative change of the thoracic spine. Soft tissue planes are nonsuspicious. Suture anchors RIGHT humeral head. IMPRESSION: COPD.  No acute cardiopulmonary process. Electronically Signed   By: Elon Alas M.D.   On: 02/17/2018 06:45   Dg Lumbar Spine 1 View  Result Date: 02/17/2018 CLINICAL DATA:  Laminectomy of L2-3, L3-4 and L4-5. EXAM: LUMBAR SPINE - 1 VIEW COMPARISON:  Radiographs of January 25, 2018.  MRI of January 19, 2018. FINDINGS: Single intraoperative cross-table lateral projection of the lumbar spine demonstrates surgical probe directed toward posterior portion of L5-S1 disc space. IMPRESSION: Surgical localization as described above. Electronically Signed   By: Marijo Conception, M.D.   On: 02/17/2018 10:39    Antibiotics:  Anti-infectives (From admission, onward)   Start     Dose/Rate Route Frequency Ordered Stop   02/17/18 1600  ceFAZolin (ANCEF) IVPB 2g/100 mL premix     2 g 200 mL/hr over 30 Minutes Intravenous Every 8 hours 02/17/18 1211 02/18/18 0759   02/17/18 0856  bacitracin 50,000 Units in sodium  chloride 0.9 % 500 mL irrigation  Status:  Discontinued       As needed 02/17/18 0856 02/17/18 1009   02/17/18 0600  ceFAZolin (ANCEF) 3 g in dextrose 5 % 50 mL IVPB     3 g 100 mL/hr over 30 Minutes Intravenous On call to O.R. 02/16/18 1346 02/17/18 0805      Discharge Exam: Blood pressure (!) 159/72, pulse 97, temperature 98.4 F (36.9 C), temperature source Oral, resp. rate 18, weight 111.1 kg (245 lb), SpO2 93 %. Neurologic: Grossly normal Ambulating and voiding well  Discharge Medications:   Allergies as of 02/17/2018      Reactions   Codeine Itching   Morphine And Related Itching      Medication List    TAKE these medications   acetaminophen 650 MG CR tablet Commonly known as:  TYLENOL Take 1,300 mg by mouth daily.   ALPRAZolam 0.5 MG tablet Commonly known as:  XANAX Take 0.5 mg by mouth at bedtime as needed for sleep.   atorvastatin 10 MG tablet Commonly known as:  LIPITOR Take 10 mg by mouth at bedtime.   FISH OIL PO Take 1 capsule by mouth daily.   gabapentin 300 MG capsule Commonly known as:  NEURONTIN Take 300 mg by mouth 3 (three) times daily.   hydrochlorothiazide 25 MG tablet Commonly known as:  HYDRODIURIL Take 25 mg by mouth daily.   HYDROcodone-acetaminophen 5-325 MG tablet Commonly known as:  NORCO/VICODIN Take 1 tablet by mouth every 4 (four) hours as needed for moderate pain.   losartan 50 MG tablet Commonly known as:  COZAAR Take 50 mg by mouth daily.   methocarbamol 500 MG tablet Commonly known as:  ROBAXIN Take 1 tablet (500 mg total) by mouth every 6 (six) hours as needed for muscle spasms.   multivitamin with minerals Tabs tablet Take 1 tablet by mouth daily.   Potassium 99 MG Tabs Take 99 mg by mouth daily.   Vitamin D 2000 units tablet Take 2,000 Units by mouth daily.   zolpidem 10 MG tablet Commonly known as:  AMBIEN Take 10 mg by mouth at bedtime.       Disposition: home   Final Dx: decompression  L2-3,3-4,4-5  Discharge Instructions     Remove dressing in 72 hours   Complete by:  As directed    Call MD for:  difficulty breathing, headache or visual disturbances   Complete by:  As directed    Call MD for:  hives   Complete by:  As directed    Call MD for:  persistant dizziness or light-headedness   Complete by:  As directed    Call MD for:  persistant nausea and vomiting   Complete by:  As  directed    Call MD for:  redness, tenderness, or signs of infection (pain, swelling, redness, odor or green/yellow discharge around incision site)   Complete by:  As directed    Call MD for:  severe uncontrolled pain   Complete by:  As directed    Call MD for:  temperature >100.4   Complete by:  As directed    Diet - low sodium heart healthy   Complete by:  As directed    Driving Restrictions   Complete by:  As directed    No driving 2 weeks   Increase activity slowly   Complete by:  As directed    Lifting restrictions   Complete by:  As directed    Nothing heavier than 8 lbs      Follow-up Information    Eustace Moore, MD. Schedule an appointment as soon as possible for a visit in 2 week(s).   Specialty:  Neurosurgery Contact information: [address] [place] [address] [phone]            Signed: Ocie Cornfield [facility] 02/17/2018, 5:02 PM

## 2018-02-17 NOTE — H&P (Signed)
Subjective: Patient is a 67 y.o. male admitted for stenosis. Onset of symptoms was several years ago, gradually worsening since that time.  The pain is rated severe, and is located at the across the lower back and radiates to legs. The pain is described as aching and occurs all day. The symptoms have been progressive. Symptoms are exacerbated by exercise. MRI or CT showed stenosis   Past Medical History:  Diagnosis Date  . COPD (chronic obstructive pulmonary disease) ([place])   . Hypertension   . Lumbar stenosis   . Sleep apnea    Uses a cpap    Past Surgical History:  Procedure Laterality Date  . APPENDECTOMY    . Artery of leg     Bilateral repair  . JOINT REPLACEMENT    . SHOULDER ARTHROSCOPY W/ ROTATOR CUFF REPAIR     Bilateral  . TOTAL HIP ARTHROPLASTY     bilateral    Prior to Admission medications   Medication Sig Start Date End Date Taking? Authorizing Provider  acetaminophen (TYLENOL) 650 MG CR tablet Take 1,300 mg by mouth daily.   Yes [provider]  atorvastatin (LIPITOR) 10 MG tablet Take 10 mg by mouth at bedtime.   Yes [provider]  Cholecalciferol (VITAMIN D) 2000 units tablet Take 2,000 Units by mouth daily.   Yes [provider]  gabapentin (NEURONTIN) 300 MG capsule Take 300 mg by mouth 3 (three) times daily.   Yes [provider]  hydrochlorothiazide (HYDRODIURIL) 25 MG tablet Take 25 mg by mouth daily.   Yes [provider]  losartan (COZAAR) 50 MG tablet Take 50 mg by mouth daily.   Yes [provider]  Multiple Vitamin (MULTIVITAMIN WITH MINERALS) TABS tablet Take 1 tablet by mouth daily.   Yes [provider]  Omega-3 Fatty Acids (FISH OIL PO) Take 1 capsule by mouth daily.   Yes [provider]  Potassium 99 MG TABS Take 99 mg by mouth daily.   Yes [provider]  zolpidem (AMBIEN) 10 MG tablet Take 10 mg by mouth at bedtime.   Yes [provider]  ALPRAZolam  Duanne Moron) 0.5 MG tablet Take 0.5 mg by mouth at bedtime as needed for sleep. 12/22/17   [provider]   Allergies  Allergen Reactions  . Codeine Itching  . Morphine And Related Itching    Social History   Tobacco Use  . Smoking status: Current Every Day Smoker    Packs/day: 1.00    Types: Cigarettes  . Smokeless tobacco: Never Used  Substance Use Topics  . Alcohol use: Yes    Comment: occasional    Family History  Problem Relation Age of Onset  . Ovarian cancer Mother      Review of Systems  Positive ROS: neg  All other systems have been reviewed and were otherwise negative with the exception of those mentioned in the HPI and as above.  Objective: Vital signs in last 24 hours: Temp:  [98.6 F (37 C)] 98.6 F (37 C) (05/23 0539) Pulse Rate:  [77] 77 (05/23 0539) Resp:  [20] 20 (05/23 0539) BP: (144)/(70) 144/70 (05/23 0539) SpO2:  [97 %] 97 % (05/23 0539) Weight:  [111.1 kg (245 lb)] 111.1 kg (245 lb) (05/23 0539)  General Appearance: Alert, cooperative, no distress, appears stated age Head: Normocephalic, without obvious abnormality, atraumatic Eyes: PERRL, conjunctiva/corneas clear, EOM's intact    Neck: Supple, symmetrical, trachea midline Back: Symmetric, no curvature, ROM normal, no CVA tenderness Lungs:  respirations unlabored Heart: Regular rate and rhythm Abdomen: Soft, non-tender Extremities: Extremities normal, atraumatic, no cyanosis or edema Pulses: 2+ and symmetric all extremities Skin: Skin color, texture, turgor normal, no rashes or lesions  NEUROLOGIC:   Mental status: Alert and oriented x4,  no aphasia, good attention span, fund of knowledge, and memory Motor Exam - grossly normal Sensory Exam - grossly normal Reflexes: 1+ Coordination - grossly normal Gait - grossly normal Balance - grossly normal Cranial Nerves: I: smell Not tested  II: visual acuity  OS: nl    OD: nl  II: visual fields Full to confrontation  II: pupils Equal,  round, reactive to light  III,VII: ptosis None  III,IV,VI: extraocular muscles  Full ROM  V: mastication Normal  V: facial light touch sensation  Normal  V,VII: corneal reflex  Present  VII: facial muscle function - upper  Normal  VII: facial muscle function - lower Normal  VIII: hearing Not tested  IX: soft palate elevation  Normal  IX,X: gag reflex Present  XI: trapezius strength  5/5  XI: sternocleidomastoid strength 5/5  XI: neck flexion strength  5/5  XII: tongue strength  Normal    Data Review Lab Results  Component Value Date   WBC 7.3 02/10/2018   HGB 16.5 02/10/2018   HCT 47.3 02/10/2018   MCV 93.3 02/10/2018   PLT 186 02/10/2018   Lab Results  Component Value Date   NA 138 02/10/2018   K 3.7 02/10/2018   CL 96 (L) 02/10/2018   CO2 31 02/10/2018   BUN 9 02/10/2018   CREATININE 0.80 02/10/2018   GLUCOSE 109 (H) 02/10/2018   Lab Results  Component Value Date   INR 1.02 02/10/2018    Assessment/Plan:  Estimated body mass index is 33.23 kg/m as calculated from the following:   Height as of 02/10/18: 6' (1.829 m).   Weight as of this encounter: 111.1 kg (245 lb). Patient admitted for LL L2-3 L3-4 L4-5. Patient has failed a reasonable attempt at conservative therapy.  I explained the condition and procedure to the patient and answered any questions.  Patient wishes to proceed with procedure as planned. Understands risks/ benefits and typical outcomes of procedure.   Sidi Dzikowski S 02/17/2018 7:18 AM

## 2018-02-17 NOTE — Op Note (Signed)
02/17/2018  10:08 AM  PATIENT:  [name]  67 y.o. male  PRE-OPERATIVE DIAGNOSIS:  Lumbar spinal stenosis L2-3 L3-4 L4-5 with back and bilateral leg pain  POST-OPERATIVE DIAGNOSIS:  same  PROCEDURE:  Decompressive lumbar laminectomy, medial facetectomy and foraminotomy L2-3 L3-4 L4-5 with left-sided discectomy L4-5  SURGEON:  Sherley Bounds, MD  ASSISTANTS: Dr. Sherwood Gambler  ANESTHESIA:   General  EBL: 200 ml  Total I/O In: -  Out: 200 [Blood:200]  BLOOD ADMINISTERED: none  DRAINS: none  SPECIMEN:  none  INDICATION FOR PROCEDURE: This patient presented with severe back and leg pain. Imaging showed severe spinal stenosis L2 through L3-4 L4-5. The patient tried conservative measures without relief. Pain was debilitating. Recommended decompressive laminectomy. Patient understood the risks, benefits, and alternatives and potential outcomes and wished to proceed.  PROCEDURE DETAILS: The patient was taken to the operating room and after induction of adequate generalized endotracheal anesthesia, the patient was rolled into the prone position on the Wilson frame and all pressure points were padded. The lumbar region was cleaned and then prepped with DuraPrep and draped in the usual sterile fashion. 5 cc of local anesthesia was injected and then a dorsal midline incision was made and carried down to the lumbo sacral fascia. The fascia was opened and the paraspinous musculature was taken down in a subperiosteal fashion to expose L2-3 L3-4 and L4-5 bilaterally. Intraoperative x-ray confirmed my level, and then I removed the spinous processes of L3 and L4 and used a combination of the high-speed drill and the Kerrison punches to perform a hemilaminectomy, medial facetectomy, and foraminotomy at L2-3 L3-4 and L4-5 bilaterally. The underlying yellow ligament was opened and removed in a piecemeal fashion to expose the underlying dura and exiting nerve root. I undercut the lateral recess and dissected  down until I was medial to and distal to the pedicle at each level. The central canal and nerve root was well decompressed. We then gently retracted the nerve root medially at L4-5 on the left with a retractor, coagulated the epidural venous vasculature, found a significant left-sided disc herniation, and incised the annulus of the disc space. We performed a thorough subannular discectomy with pituitary rongeurs, until I had a nice decompression of the nerve root and the midline. I then palpated with a coronary dilator along the nerve root and into the foramen to assure adequate decompression. I felt no more compression of the nerve root at any of the levels. I irrigated with saline solution containing bacitracin. Achieved hemostasis with bipolar cautery, lined the dura with Gelfoam, and then closed the fascia with 0 Vicryl. I closed the subcutaneous tissues with 2-0 Vicryl and the subcuticular tissues with 3-0 Vicryl. The skin was then closed with benzoin and Steri-Strips. The drapes were removed, a sterile dressing was applied. The patient was awakened from general anesthesia and transferred to the recovery room in stable condition. At the end of the procedure all sponge, needle and instrument counts were correct.    PLAN OF CARE: Admit for overnight observation  PATIENT DISPOSITION:  PACU - hemodynamically stable.   Delay start of Pharmacological VTE agent (>24hrs) due to surgical blood loss or risk of bleeding:  yes

## 2018-02-17 NOTE — Transfer of Care (Signed)
Immediate Anesthesia Transfer of Care Note  Patient: [name]  Procedure(s) Performed: Lumbar Two-Three, Lumbar Three-Four, Lumbar Four-Five Laminectomy and Foraminotomy (N/A Spine Lumbar)  Patient Location: PACU  Anesthesia Type:General  Level of Consciousness: awake, alert , oriented and patient cooperative  Airway & Oxygen Therapy: Patient Spontanous Breathing and Patient connected to face mask oxygen  Post-op Assessment: Report given to RN, Post -op Vital signs reviewed and stable and Patient moving all extremities X 4  Post vital signs: Reviewed and stable  Last Vitals:  Vitals Value Taken Time  BP 177/92 02/17/2018 10:17 AM  Temp    Pulse 82 02/17/2018 10:18 AM  Resp 25 02/17/2018 10:18 AM  SpO2 100 % 02/17/2018 10:18 AM  Vitals shown include unvalidated device data.  Last Pain:  Vitals:   02/17/18 0631  TempSrc:   PainSc: 0-No pain      Patients Stated Pain Goal: 3 (00/34/91 7915)  Complications: No apparent anesthesia complications

## 2018-02-17 NOTE — Anesthesia Preprocedure Evaluation (Signed)
Anesthesia Evaluation  Patient identified by MRN, date of birth, ID band Patient awake    Reviewed: Allergy & Precautions, NPO status , Patient's Chart, lab work & pertinent test results  Airway Mallampati: II  TM Distance: >3 FB Neck ROM: Full    Dental  (+) Edentulous Upper, Edentulous Lower   Pulmonary Current Smoker,     + decreased breath sounds      Cardiovascular hypertension,  Rhythm:Regular Rate:Normal     Neuro/Psych    GI/Hepatic   Endo/Other    Renal/GU      Musculoskeletal   Abdominal (+) + obese,   Peds  Hematology   Anesthesia Other Findings   Reproductive/Obstetrics                             Anesthesia Physical Anesthesia Plan  ASA: III  Anesthesia Plan: General   Post-op Pain Management:    Induction: Intravenous  PONV Risk Score and Plan: Ondansetron and Dexamethasone  Airway Management Planned: Oral ETT  Additional Equipment:   Intra-op Plan:   Post-operative Plan: Extubation in OR  Informed Consent: I have reviewed the patients History and Physical, chart, labs and discussed the procedure including the risks, benefits and alternatives for the proposed anesthesia with the patient or authorized representative who has indicated his/her understanding and acceptance.     Plan Discussed with: CRNA and Anesthesiologist  Anesthesia Plan Comments:         Anesthesia Quick Evaluation

## 2018-02-17 NOTE — Anesthesia Postprocedure Evaluation (Signed)
Anesthesia Post Note  Patient: GAREK SCHUNEMAN  Procedure(s) Performed: Lumbar Two-Three, Lumbar Three-Four, Lumbar Four-Five Laminectomy and Foraminotomy (N/A Spine Lumbar)     Patient location during evaluation: PACU Anesthesia Type: General Level of consciousness: awake and alert Pain management: pain level controlled Vital Signs Assessment: post-procedure vital signs reviewed and stable Respiratory status: spontaneous breathing, nonlabored ventilation, respiratory function stable and patient connected to nasal cannula oxygen Cardiovascular status: blood pressure returned to baseline and stable Postop Assessment: no apparent nausea or vomiting Anesthetic complications: no    Last Vitals:  Vitals:   02/17/18 1204 02/17/18 1637  BP: (!) 168/79 (!) 159/72  Pulse: 72 97  Resp: 18 18  Temp: 36.4 C 36.9 C  SpO2: 99% 93%    Last Pain:  Vitals:   02/17/18 1700  TempSrc:   PainSc: 2                  Jailon Schaible COKER

## 2018-02-18 ENCOUNTER — Encounter (HOSPITAL_COMMUNITY): Payer: Self-pay | Admitting: Neurological Surgery

## 2018-02-18 MED FILL — Thrombin For Soln 5000 Unit: CUTANEOUS | Qty: 5000 | Status: AC

## 2018-02-23 DIAGNOSIS — T8189XA Other complications of procedures, not elsewhere classified, initial encounter: Secondary | ICD-10-CM | POA: Diagnosis not present

## 2018-03-02 DIAGNOSIS — M25552 Pain in left hip: Secondary | ICD-10-CM | POA: Diagnosis not present

## 2018-03-02 DIAGNOSIS — M545 Low back pain: Secondary | ICD-10-CM | POA: Diagnosis not present

## 2018-03-02 DIAGNOSIS — M6281 Muscle weakness (generalized): Secondary | ICD-10-CM | POA: Diagnosis not present

## 2018-03-07 DIAGNOSIS — M6281 Muscle weakness (generalized): Secondary | ICD-10-CM | POA: Diagnosis not present

## 2018-03-07 DIAGNOSIS — M545 Low back pain: Secondary | ICD-10-CM | POA: Diagnosis not present

## 2018-03-07 DIAGNOSIS — M25552 Pain in left hip: Secondary | ICD-10-CM | POA: Diagnosis not present

## 2018-03-14 DIAGNOSIS — M545 Low back pain: Secondary | ICD-10-CM | POA: Diagnosis not present

## 2018-03-14 DIAGNOSIS — M25552 Pain in left hip: Secondary | ICD-10-CM | POA: Diagnosis not present

## 2018-03-14 DIAGNOSIS — M6281 Muscle weakness (generalized): Secondary | ICD-10-CM | POA: Diagnosis not present

## 2018-03-16 DIAGNOSIS — M6281 Muscle weakness (generalized): Secondary | ICD-10-CM | POA: Diagnosis not present

## 2018-03-16 DIAGNOSIS — M545 Low back pain: Secondary | ICD-10-CM | POA: Diagnosis not present

## 2018-03-21 DIAGNOSIS — M7742 Metatarsalgia, left foot: Secondary | ICD-10-CM | POA: Diagnosis not present

## 2018-03-21 DIAGNOSIS — M792 Neuralgia and neuritis, unspecified: Secondary | ICD-10-CM | POA: Diagnosis not present

## 2018-03-21 DIAGNOSIS — M779 Enthesopathy, unspecified: Secondary | ICD-10-CM | POA: Diagnosis not present

## 2018-03-21 DIAGNOSIS — M25552 Pain in left hip: Secondary | ICD-10-CM | POA: Diagnosis not present

## 2018-03-21 DIAGNOSIS — M6281 Muscle weakness (generalized): Secondary | ICD-10-CM | POA: Diagnosis not present

## 2018-03-21 DIAGNOSIS — M545 Low back pain: Secondary | ICD-10-CM | POA: Diagnosis not present

## 2018-03-21 DIAGNOSIS — M25551 Pain in right hip: Secondary | ICD-10-CM | POA: Diagnosis not present

## 2018-04-05 DIAGNOSIS — N50812 Left testicular pain: Secondary | ICD-10-CM | POA: Diagnosis not present

## 2018-04-19 ENCOUNTER — Ambulatory Visit: Payer: Medicare Other | Admitting: Family Medicine

## 2018-04-26 ENCOUNTER — Encounter: Payer: Self-pay | Admitting: Family Medicine

## 2018-04-26 ENCOUNTER — Ambulatory Visit (INDEPENDENT_AMBULATORY_CARE_PROVIDER_SITE_OTHER): Payer: Medicare Other | Admitting: Family Medicine

## 2018-04-26 DIAGNOSIS — E785 Hyperlipidemia, unspecified: Secondary | ICD-10-CM | POA: Insufficient documentation

## 2018-04-26 DIAGNOSIS — M25531 Pain in right wrist: Secondary | ICD-10-CM | POA: Insufficient documentation

## 2018-04-26 DIAGNOSIS — I10 Essential (primary) hypertension: Secondary | ICD-10-CM | POA: Insufficient documentation

## 2018-04-26 DIAGNOSIS — R0602 Shortness of breath: Secondary | ICD-10-CM | POA: Insufficient documentation

## 2018-04-26 DIAGNOSIS — I739 Peripheral vascular disease, unspecified: Secondary | ICD-10-CM | POA: Insufficient documentation

## 2018-04-26 DIAGNOSIS — F17200 Nicotine dependence, unspecified, uncomplicated: Secondary | ICD-10-CM | POA: Insufficient documentation

## 2018-04-26 DIAGNOSIS — J45909 Unspecified asthma, uncomplicated: Secondary | ICD-10-CM | POA: Insufficient documentation

## 2018-04-26 DIAGNOSIS — M545 Low back pain, unspecified: Secondary | ICD-10-CM | POA: Insufficient documentation

## 2018-04-26 DIAGNOSIS — R7301 Impaired fasting glucose: Secondary | ICD-10-CM | POA: Insufficient documentation

## 2018-04-26 NOTE — Assessment & Plan Note (Signed)
Likely bone contusion with small hematoma over R ulna Has voltaren gel at home, recommend trial of this. Ice throughout the day He will let me know if not improving.

## 2018-04-26 NOTE — Patient Instructions (Signed)
Use voltaren gel over area Ice a few times per day Call or text if not improving [phone]

## 2018-04-26 NOTE — Progress Notes (Signed)
[name] - 67 y.o. male MRN [id number]  Date of birth: Mar 19, 1951  Subjective Chief Complaint  Patient presents with  . Wrist Pain    left wrist pain for about 5 days,     HPI [name] is a 67 y.o. male who is a former patient of mine here today to re-establish care.  He has a history of HTN, COPD, HLD and OSA and is co-managed with primary care at the [place].  He has complaint of R wrist pain for about 5 days.  Pain located on lateral aspect of R wrist.  States that he was working on his motorcycle and hit his wrist a couple of times.  Does not really bother him to move it but it is tender to touch.  He has noticed some mild swelling over this area.  He denies stiffness, fever, chills.   ROS:  A comprehensive ROS was completed and negative except as noted per HPI   Allergies  Allergen Reactions  . Bupropion Other (See Comments)    Contraindicated - seizure in childhood (he has never taken medication, but we discussed it in [facility] for smoking cessation) Contraindicated - seizure in childhood (he has never taken medication, but we discussed it in [place] for smoking cessation) Contraindicated - seizure in childhood (he has never taken medication, but we discussed it in [facility] for smoking cessation)   . Codeine Itching  . Morphine And Related Itching    Past Medical History:  Diagnosis Date  . COPD (chronic obstructive pulmonary disease) ([place])   . Hypertension   . Lumbar stenosis   . Sleep apnea    Uses a cpap    Past Surgical History:  Procedure Laterality Date  . APPENDECTOMY    . Artery of leg     Bilateral repair  . JOINT REPLACEMENT    . LUMBAR LAMINECTOMY/DECOMPRESSION MICRODISCECTOMY N/A 02/17/2018   Procedure: Lumbar Two-Three, Lumbar Three-Four, Lumbar Four-Five Laminectomy and Foraminotomy;  Surgeon: Eustace Moore, MD;  Location: [facility];  Service: Neurosurgery;  Laterality: N/A;  . SHOULDER ARTHROSCOPY W/ ROTATOR CUFF REPAIR     Bilateral  . TOTAL HIP ARTHROPLASTY     bilateral    Social History   Socioeconomic History  . Marital status: Married    Spouse name: Not on file  . Number of children: Not on file  . Years of education: Not on file  . Highest education level: Not on file  Occupational History  . Not on file  Social Needs  . Financial resource strain: Not on file  . Food insecurity:    Worry: Not on file    Inability: Not on file  . Transportation needs:    Medical: Not on file    Non-medical: Not on file  Tobacco Use  . Smoking status: Current Every Day Smoker    Packs/day: 1.00    Types: Cigarettes  . Smokeless tobacco: Never Used  Substance and Sexual Activity  . Alcohol use: Yes    Comment: occasional  . Drug use: Never  . Sexual activity: Not on file  Lifestyle  . Physical activity:    Days per week: Not on file    Minutes per session: Not on file  . Stress: Not on file  Relationships  . Social connections:    Talks on phone: Not on file    Gets together: Not on file    Attends religious service: Not on file    Active member of club  or organization: Not on file    Attends meetings of clubs or organizations: Not on file    Relationship status: Not on file  Other Topics Concern  . Not on file  Social History Narrative  . Not on file    Family History  Problem Relation Age of Onset  . Ovarian cancer Mother     Health Maintenance  Topic Date Due  . Hepatitis C Screening  [birth year]  . TETANUS/TDAP  10/19/1968  . COLONOSCOPY  10/20/1999  . PNA vac Low Risk Adult (2 of 2 - PPSV23) 01/25/2018  . INFLUENZA VACCINE  04/28/2018    ----------------------------------------------------------------------------------------------------------------------------------------------------------------------------------------------------------------- Physical Exam BP 124/78 (BP Location: Left Arm, Patient Position: Sitting, Cuff Size: Normal)   Pulse 85   Temp 98.9 F (37.2 C) (Oral)   Ht 6' (1.829 m)   Wt 248 lb 9.6 oz  (112.8 kg)   SpO2 93%   BMI 33.72 kg/m   Physical Exam  Constitutional: He is oriented to person, place, and time. He appears well-nourished. No distress.  HENT:  Head: Normocephalic and atraumatic.  Eyes: No scleral icterus.  Cardiovascular: Normal rate, regular rhythm and normal heart sounds.  Pulmonary/Chest: Effort normal and breath sounds normal.  Musculoskeletal:  Lateral aspect of right wrist with tenderness and mild swelling/hematoma over distal ulna.  ROM of wrist and arm is normal.  Normal sensation.   Neurological: He is alert and oriented to person, place, and time.  Skin: Skin is warm and dry.  Psychiatric: He has a normal mood and affect. His behavior is normal.    ------------------------------------------------------------------------------------------------------------------------------------------------------------------------------------------------------------------- Assessment and Plan  Acute pain of right wrist Likely bone contusion with small hematoma over R ulna Has voltaren gel at home, recommend trial of this. Ice throughout the day He will let me know if not improving.

## 2018-05-23 DIAGNOSIS — Z85828 Personal history of other malignant neoplasm of skin: Secondary | ICD-10-CM | POA: Diagnosis not present

## 2018-05-23 DIAGNOSIS — L814 Other melanin hyperpigmentation: Secondary | ICD-10-CM | POA: Diagnosis not present

## 2018-05-23 DIAGNOSIS — L821 Other seborrheic keratosis: Secondary | ICD-10-CM | POA: Diagnosis not present

## 2018-05-23 DIAGNOSIS — D1801 Hemangioma of skin and subcutaneous tissue: Secondary | ICD-10-CM | POA: Diagnosis not present

## 2018-05-23 DIAGNOSIS — L570 Actinic keratosis: Secondary | ICD-10-CM | POA: Diagnosis not present

## 2018-05-23 DIAGNOSIS — L579 Skin changes due to chronic exposure to nonionizing radiation, unspecified: Secondary | ICD-10-CM | POA: Diagnosis not present

## 2018-05-26 DIAGNOSIS — Z9981 Dependence on supplemental oxygen: Secondary | ICD-10-CM | POA: Diagnosis not present

## 2018-05-26 DIAGNOSIS — Z79899 Other long term (current) drug therapy: Secondary | ICD-10-CM | POA: Diagnosis not present

## 2018-05-26 DIAGNOSIS — Z122 Encounter for screening for malignant neoplasm of respiratory organs: Secondary | ICD-10-CM | POA: Diagnosis not present

## 2018-05-26 DIAGNOSIS — F17210 Nicotine dependence, cigarettes, uncomplicated: Secondary | ICD-10-CM | POA: Diagnosis not present

## 2018-05-26 DIAGNOSIS — J432 Centrilobular emphysema: Secondary | ICD-10-CM | POA: Diagnosis not present

## 2018-05-26 DIAGNOSIS — J9611 Chronic respiratory failure with hypoxia: Secondary | ICD-10-CM | POA: Diagnosis not present

## 2018-06-02 DIAGNOSIS — F17210 Nicotine dependence, cigarettes, uncomplicated: Secondary | ICD-10-CM | POA: Diagnosis not present

## 2018-06-02 DIAGNOSIS — Z122 Encounter for screening for malignant neoplasm of respiratory organs: Secondary | ICD-10-CM | POA: Diagnosis not present

## 2018-06-21 DIAGNOSIS — M792 Neuralgia and neuritis, unspecified: Secondary | ICD-10-CM | POA: Diagnosis not present

## 2018-06-21 DIAGNOSIS — M779 Enthesopathy, unspecified: Secondary | ICD-10-CM | POA: Diagnosis not present

## 2018-06-21 DIAGNOSIS — M7742 Metatarsalgia, left foot: Secondary | ICD-10-CM | POA: Diagnosis not present

## 2018-06-28 DIAGNOSIS — M4316 Spondylolisthesis, lumbar region: Secondary | ICD-10-CM | POA: Diagnosis not present

## 2018-06-29 DIAGNOSIS — G4733 Obstructive sleep apnea (adult) (pediatric): Secondary | ICD-10-CM | POA: Diagnosis not present

## 2018-06-29 DIAGNOSIS — J449 Chronic obstructive pulmonary disease, unspecified: Secondary | ICD-10-CM | POA: Diagnosis not present

## 2018-06-29 DIAGNOSIS — F17210 Nicotine dependence, cigarettes, uncomplicated: Secondary | ICD-10-CM | POA: Diagnosis not present

## 2018-06-29 DIAGNOSIS — R911 Solitary pulmonary nodule: Secondary | ICD-10-CM | POA: Diagnosis not present

## 2018-07-20 DIAGNOSIS — E782 Mixed hyperlipidemia: Secondary | ICD-10-CM | POA: Diagnosis not present

## 2018-07-20 DIAGNOSIS — I10 Essential (primary) hypertension: Secondary | ICD-10-CM | POA: Diagnosis not present

## 2018-07-20 DIAGNOSIS — J449 Chronic obstructive pulmonary disease, unspecified: Secondary | ICD-10-CM | POA: Diagnosis not present

## 2018-07-20 DIAGNOSIS — R7301 Impaired fasting glucose: Secondary | ICD-10-CM | POA: Diagnosis not present

## 2018-07-20 DIAGNOSIS — F5101 Primary insomnia: Secondary | ICD-10-CM | POA: Diagnosis not present

## 2018-07-20 DIAGNOSIS — M5416 Radiculopathy, lumbar region: Secondary | ICD-10-CM | POA: Diagnosis not present

## 2018-07-20 DIAGNOSIS — F17200 Nicotine dependence, unspecified, uncomplicated: Secondary | ICD-10-CM | POA: Diagnosis not present

## 2018-09-05 ENCOUNTER — Ambulatory Visit: Payer: Self-pay | Admitting: *Deleted

## 2018-09-05 NOTE — Telephone Encounter (Signed)
Patient complains of a burning sensation in one area, only when he touches it, near the middle area on his right upper abdomen. Stated he has had this before. Denies fever/rash/CP/SOB/Fever. No difficulty voiding and with bowels. Has noticed it for a week now. When he lies down he doesn't notice it. He also would like to talk about discontinuing his b/p medication as he has lost approximately #50 lbs this year. No b/p readings to report. Appointment made for tomorrow morning with PCP. Reason for Disposition . Age > 60 years  Answer Assessment - Initial Assessment Questions 1. LOCATION: "Where does it hurt?"      Center right sided pain 2. RADIATION: "Does the pain shoot anywhere else?" (e.g., chest, back)     no 3. ONSET: "When did the pain begin?" (Minutes, hours or days ago)     Just over a week 4. SUDDEN: "Gradual or sudden onset?"     gradual 5. PATTERN "Does the pain come and go, or is it constant?"    - If constant: "Is it getting better, staying the same, or worsening?"      (Note: Constant means the pain never goes away completely; most serious pain is constant and it progresses)     - If intermittent: "How long does it last?" "Do you have pain now?"     (Note: Intermittent means the pain goes away completely between bouts)     no 6. SEVERITY: "How bad is the pain?"  (e.g., Scale 1-10; mild, moderate, or severe)    - MILD (1-3): doesn't interfere with normal activities, abdomen soft and not tender to touch     - MODERATE (4-7): interferes with normal activities or awakens from sleep, tender to touch     - SEVERE (8-10): excruciating pain, doubled over, unable to do any normal activities       A burning sensation when he touches the area, not really a pain.  7. RECURRENT SYMPTOM: "Have you ever had this type of abdominal pain before?" If so, ask: "When was the last time?" and "What happened that time?"      Has had this previously but doesn't remember treatment. 8. CAUSE: "What do  you think is causing the abdominal pain?"     May have injured the area by leaning over a fence. 9. RELIEVING/AGGRAVATING FACTORS: "What makes it better or worse?" (e.g., movement, antacids, bowel movement)     Does not feel it when he lies down. 10. OTHER SYMPTOMS: "Has there been any vomiting, diarrhea, constipation, or urine problems?"       none  Protocols used: ABDOMINAL PAIN - MALE-A-AH

## 2018-09-06 ENCOUNTER — Ambulatory Visit (INDEPENDENT_AMBULATORY_CARE_PROVIDER_SITE_OTHER): Payer: Medicare Other | Admitting: Family Medicine

## 2018-09-06 ENCOUNTER — Encounter: Payer: Self-pay | Admitting: Family Medicine

## 2018-09-06 DIAGNOSIS — R109 Unspecified abdominal pain: Secondary | ICD-10-CM

## 2018-09-06 NOTE — Assessment & Plan Note (Signed)
-  Mild ttp, likely resolving deep bruise from leaning over fence.  -No signs of hernia.  -Discussed if not improving to let me know.

## 2018-09-06 NOTE — Progress Notes (Signed)
[name] - 67 y.o. male MRN [id number]  Date of birth: 1951/03/01  Subjective Chief Complaint  Patient presents with  . Abdominal Pain    Has been ongoing for two weeks- burning sensation and tenderness. Moves bowels normal.  He recalls reaching over a fence and grabbing a 50lb bag of topsoil.     HPI [name] is a 67 y.o. male with history of HTN, nicotine dependence, and lumbar stenosis here today with complaint of abdominal pain.  He reports that he was leaning over a chain link fence to pick up a bag of mulch when symptoms started.  He denies pain at rest or movement but does have some pain with palpation over area.  Describes as a "stinging" pain at times.  He denies changes to bowels, nausea, vomiting, diarrhea, fever, chills, rash or bruising over area.   ROS:  A comprehensive ROS was completed and negative except as noted per HPI   Allergies  Allergen Reactions  . Bupropion Other (See Comments)    Contraindicated - seizure in childhood (he has never taken medication, but we discussed it in [facility] for smoking cessation) Contraindicated - seizure in childhood (he has never taken medication, but we discussed it in [place] for smoking cessation) Contraindicated - seizure in childhood (he has never taken medication, but we discussed it in [facility] for smoking cessation)   . Codeine Itching  . Morphine And Related Itching    Past Medical History:  Diagnosis Date  . COPD (chronic obstructive pulmonary disease) ([place])   . Hypertension   . Lumbar stenosis   . Sleep apnea    Uses a cpap    Past Surgical History:  Procedure Laterality Date  . APPENDECTOMY    . Artery of leg     Bilateral repair  . JOINT REPLACEMENT    . LUMBAR LAMINECTOMY/DECOMPRESSION MICRODISCECTOMY N/A 02/17/2018   Procedure: Lumbar Two-Three, Lumbar Three-Four, Lumbar Four-Five Laminectomy and Foraminotomy;  Surgeon: Eustace Moore, MD;  Location: [facility];  Service: Neurosurgery;  Laterality: N/A;  . SHOULDER  ARTHROSCOPY W/ ROTATOR CUFF REPAIR     Bilateral  . TOTAL HIP ARTHROPLASTY     bilateral    Social History   Socioeconomic History  . Marital status: Married    Spouse name: Not on file  . Number of children: Not on file  . Years of education: Not on file  . Highest education level: Not on file  Occupational History  . Not on file  Social Needs  . Financial resource strain: Not on file  . Food insecurity:    Worry: Not on file    Inability: Not on file  . Transportation needs:    Medical: Not on file    Non-medical: Not on file  Tobacco Use  . Smoking status: Current Every Day Smoker    Packs/day: 1.00    Types: Cigarettes  . Smokeless tobacco: Never Used  Substance and Sexual Activity  . Alcohol use: Yes    Comment: occasional  . Drug use: Never  . Sexual activity: Not on file  Lifestyle  . Physical activity:    Days per week: Not on file    Minutes per session: Not on file  . Stress: Not on file  Relationships  . Social connections:    Talks on phone: Not on file    Gets together: Not on file    Attends religious service: Not on file    Active member of club or  organization: Not on file    Attends meetings of clubs or organizations: Not on file    Relationship status: Not on file  Other Topics Concern  . Not on file  Social History Narrative  . Not on file    Family History  Problem Relation Age of Onset  . Ovarian cancer Mother     Health Maintenance  Topic Date Due  . Hepatitis C Screening  [birth year]  . TETANUS/TDAP  10/19/1968  . COLONOSCOPY  10/20/1999  . PNA vac Low Risk Adult (2 of 2 - PPSV23) 01/25/2018  . INFLUENZA VACCINE  04/28/2018    ----------------------------------------------------------------------------------------------------------------------------------------------------------------------------------------------------------------- Physical Exam BP (!) 162/88   Pulse 84   Temp 98.5 F (36.9 C) (Oral)   Ht 6' (1.829 m)    Wt 227 lb (103 kg)   SpO2 96%   BMI 30.79 kg/m   Physical Exam  Constitutional: He is oriented to person, place, and time. He appears well-nourished. No distress.  HENT:  Head: Normocephalic and atraumatic.  Eyes: No scleral icterus.  Cardiovascular: Normal rate, regular rhythm and normal heart sounds.  Pulmonary/Chest: Effort normal and breath sounds normal.  Abdominal: Soft. Bowel sounds are normal. He exhibits no distension. There is tenderness (Mild ttp over R side of abdomen. ).  Musculoskeletal: He exhibits no tenderness.  Neurological: He is alert and oriented to person, place, and time.  Skin: Skin is warm and dry.  Psychiatric: He has a normal mood and affect. His behavior is normal.    ------------------------------------------------------------------------------------------------------------------------------------------------------------------------------------------------------------------- Assessment and Plan  Abdominal wall pain -Mild ttp, likely resolving deep bruise from leaning over fence.  -No signs of hernia.  -Discussed if not improving to let me know.

## 2018-09-15 DIAGNOSIS — M792 Neuralgia and neuritis, unspecified: Secondary | ICD-10-CM | POA: Diagnosis not present

## 2018-09-15 DIAGNOSIS — M21371 Foot drop, right foot: Secondary | ICD-10-CM | POA: Diagnosis not present

## 2018-09-15 DIAGNOSIS — M7742 Metatarsalgia, left foot: Secondary | ICD-10-CM | POA: Diagnosis not present

## 2018-09-15 DIAGNOSIS — M21372 Foot drop, left foot: Secondary | ICD-10-CM | POA: Diagnosis not present

## 2018-10-05 ENCOUNTER — Ambulatory Visit (INDEPENDENT_AMBULATORY_CARE_PROVIDER_SITE_OTHER): Payer: Medicare Other | Admitting: Family Medicine

## 2018-10-05 ENCOUNTER — Encounter: Payer: Self-pay | Admitting: Family Medicine

## 2018-10-05 DIAGNOSIS — R6884 Jaw pain: Secondary | ICD-10-CM | POA: Diagnosis not present

## 2018-10-05 NOTE — Assessment & Plan Note (Signed)
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

## 2018-10-05 NOTE — Progress Notes (Signed)
[name] - 68 y.o. male MRN [id number]  Date of birth: 1951/09/03  Subjective Chief Complaint  Patient presents with  . Facial Swelling    pain on left cheek/jaw for three days. Denies injury    HPI [name] is a 68 y.o. male here today with complaint of L sided jaw pain.  He reports that symptoms began 3 days ago.  He denies associated symptoms including chest pain, shortness of breath, sore throat, ear pain, nasal congestion, difficulty swallowing, fevers.  He has dentures and denies gum pain.  He may clinch jaw at night.    ROS:  A comprehensive ROS was completed and negative except as noted per HPI  Allergies  Allergen Reactions  . Bupropion Other (See Comments)    Contraindicated - seizure in childhood (he has never taken medication, but we discussed it in [facility] for smoking cessation) Contraindicated - seizure in childhood (he has never taken medication, but we discussed it in [place] for smoking cessation) Contraindicated - seizure in childhood (he has never taken medication, but we discussed it in [facility] for smoking cessation)   . Codeine Itching  . Morphine And Related Itching    Past Medical History:  Diagnosis Date  . COPD (chronic obstructive pulmonary disease) ([place])   . Hypertension   . Lumbar stenosis   . Sleep apnea    Uses a cpap    Past Surgical History:  Procedure Laterality Date  . APPENDECTOMY    . Artery of leg     Bilateral repair  . JOINT REPLACEMENT    . LUMBAR LAMINECTOMY/DECOMPRESSION MICRODISCECTOMY N/A 02/17/2018   Procedure: Lumbar Two-Three, Lumbar Three-Four, Lumbar Four-Five Laminectomy and Foraminotomy;  Surgeon: Eustace Moore, MD;  Location: [facility];  Service: Neurosurgery;  Laterality: N/A;  . SHOULDER ARTHROSCOPY W/ ROTATOR CUFF REPAIR     Bilateral  . TOTAL HIP ARTHROPLASTY     bilateral    Social History   Socioeconomic History  . Marital status: Married    Spouse name: Not on file  . Number of children: Not on file  . Years of  education: Not on file  . Highest education level: Not on file  Occupational History  . Not on file  Social Needs  . Financial resource strain: Not on file  . Food insecurity:    Worry: Not on file    Inability: Not on file  . Transportation needs:    Medical: Not on file    Non-medical: Not on file  Tobacco Use  . Smoking status: Current Every Day Smoker    Packs/day: 1.00    Types: Cigarettes  . Smokeless tobacco: Never Used  Substance and Sexual Activity  . Alcohol use: Yes    Comment: occasional  . Drug use: Never  . Sexual activity: Not on file  Lifestyle  . Physical activity:    Days per week: Not on file    Minutes per session: Not on file  . Stress: Not on file  Relationships  . Social connections:    Talks on phone: Not on file    Gets together: Not on file    Attends religious service: Not on file    Active member of club or organization: Not on file    Attends meetings of clubs or organizations: Not on file    Relationship status: Not on file  Other Topics Concern  . Not on file  Social History Narrative  . Not on file  Family History  Problem Relation Age of Onset  . Ovarian cancer Mother     Health Maintenance  Topic Date Due  . Hepatitis C Screening  Sep 20, 1951  . TETANUS/TDAP  10/19/1968  . COLONOSCOPY  10/20/1999  . PNA vac Low Risk Adult (2 of 2 - PPSV23) 01/25/2018  . INFLUENZA VACCINE  12/27/2018 (Originally 04/28/2018)    ----------------------------------------------------------------------------------------------------------------------------------------------------------------------------------------------------------------- Physical Exam BP 132/76   Pulse 80   Temp 97.6 F (36.4 C) (Oral)   Ht 6' (1.829 m)   Wt 220 lb (99.8 kg)   SpO2 94%   BMI 29.84 kg/m   Physical Exam Constitutional:      Appearance: Normal appearance.  HENT:     Head: Normocephalic and atraumatic.     Right Ear: Tympanic membrane normal.     Left  Ear: Tympanic membrane normal.     Mouth/Throat:     Mouth: Mucous membranes are moist.     Comments: TTP along tmj on L side.  Eyes:     General: No scleral icterus. Cardiovascular:     Rate and Rhythm: Normal rate and regular rhythm.  Pulmonary:     Effort: Pulmonary effort is normal.     Breath sounds: Normal breath sounds.  Lymphadenopathy:     Cervical: No cervical adenopathy.  Skin:    General: Skin is warm and dry.  Neurological:     General: No focal deficit present.     Mental Status: He is alert.  Psychiatric:        Mood and Affect: Mood normal.        Behavior: Behavior normal.     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

## 2018-10-28 DIAGNOSIS — M19042 Primary osteoarthritis, left hand: Secondary | ICD-10-CM | POA: Diagnosis not present

## 2018-10-28 DIAGNOSIS — M1810 Unilateral primary osteoarthritis of first carpometacarpal joint, unspecified hand: Secondary | ICD-10-CM | POA: Diagnosis not present

## 2018-10-28 DIAGNOSIS — M1812 Unilateral primary osteoarthritis of first carpometacarpal joint, left hand: Secondary | ICD-10-CM | POA: Diagnosis not present

## 2018-10-28 DIAGNOSIS — M79642 Pain in left hand: Secondary | ICD-10-CM | POA: Diagnosis not present

## 2018-10-31 DIAGNOSIS — L820 Inflamed seborrheic keratosis: Secondary | ICD-10-CM | POA: Diagnosis not present

## 2018-10-31 DIAGNOSIS — L814 Other melanin hyperpigmentation: Secondary | ICD-10-CM | POA: Diagnosis not present

## 2018-10-31 DIAGNOSIS — D1801 Hemangioma of skin and subcutaneous tissue: Secondary | ICD-10-CM | POA: Diagnosis not present

## 2018-10-31 DIAGNOSIS — Z85828 Personal history of other malignant neoplasm of skin: Secondary | ICD-10-CM | POA: Diagnosis not present

## 2018-10-31 DIAGNOSIS — L579 Skin changes due to chronic exposure to nonionizing radiation, unspecified: Secondary | ICD-10-CM | POA: Diagnosis not present

## 2018-10-31 DIAGNOSIS — L821 Other seborrheic keratosis: Secondary | ICD-10-CM | POA: Diagnosis not present

## 2018-10-31 DIAGNOSIS — B079 Viral wart, unspecified: Secondary | ICD-10-CM | POA: Diagnosis not present

## 2018-11-04 IMAGING — CR DG LUMBAR SPINE 1V
1 series · 1 of 1 positions shown · non-contrast
Comparison: Radiographs January 25, 2018.  MRI of January 19, 2018.

CLINICAL DATA: Laminectomy of L2-3, L3-4 and L4-5.

EXAM:
LUMBAR SPINE - 1 VIEW

[lateral]
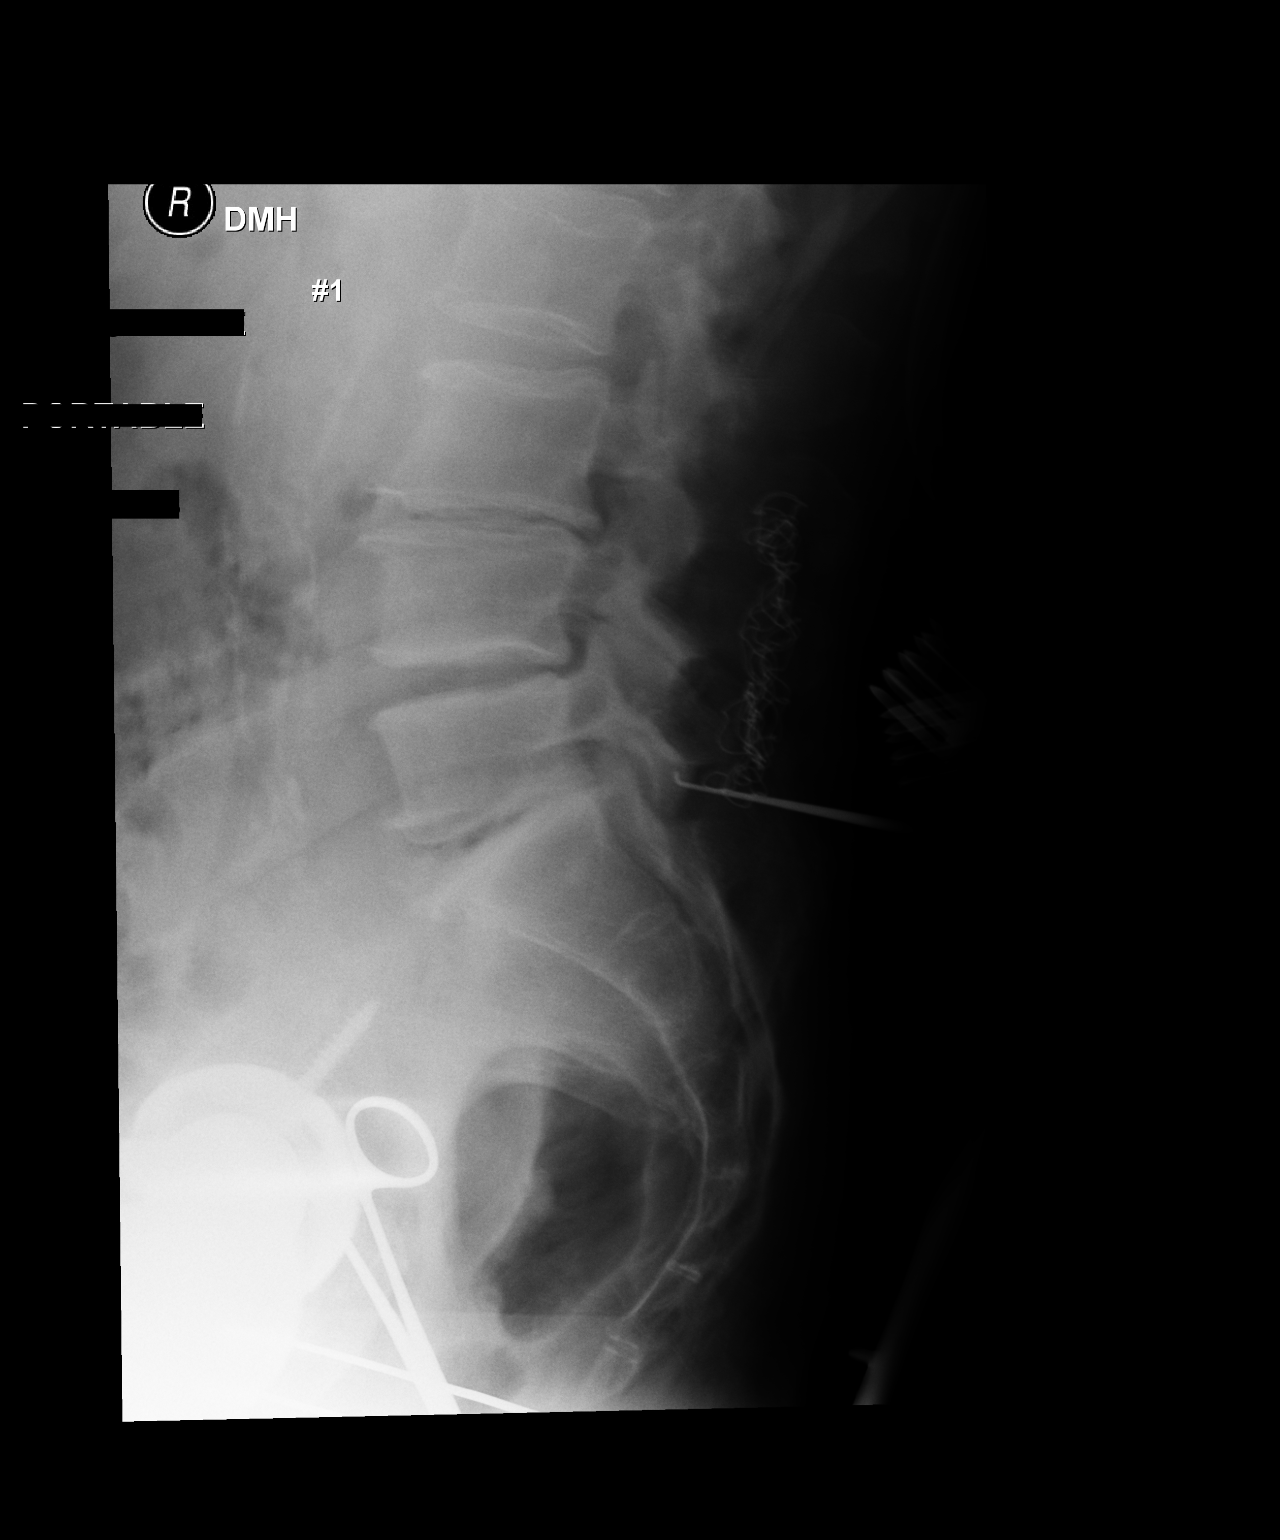

[1 of 1 positions shown; findings below may reference images not displayed]

FINDINGS: Single intraoperative cross-table lateral projection of the lumbar
spine demonstrates surgical probe directed toward posterior portion
of L5-S1 disc space.
IMPRESSION: Surgical localization as described above.

## 2018-11-04 IMAGING — CR DG CHEST 2V
2 series · 2 of 2 positions shown · non-contrast
Comparison: Chest radiograph October 07, 2006

CLINICAL DATA: Preoperative evaluation. Lumbar spine surgery.
History of COPD.

EXAM:
CHEST - 2 VIEW

[w chest pa]
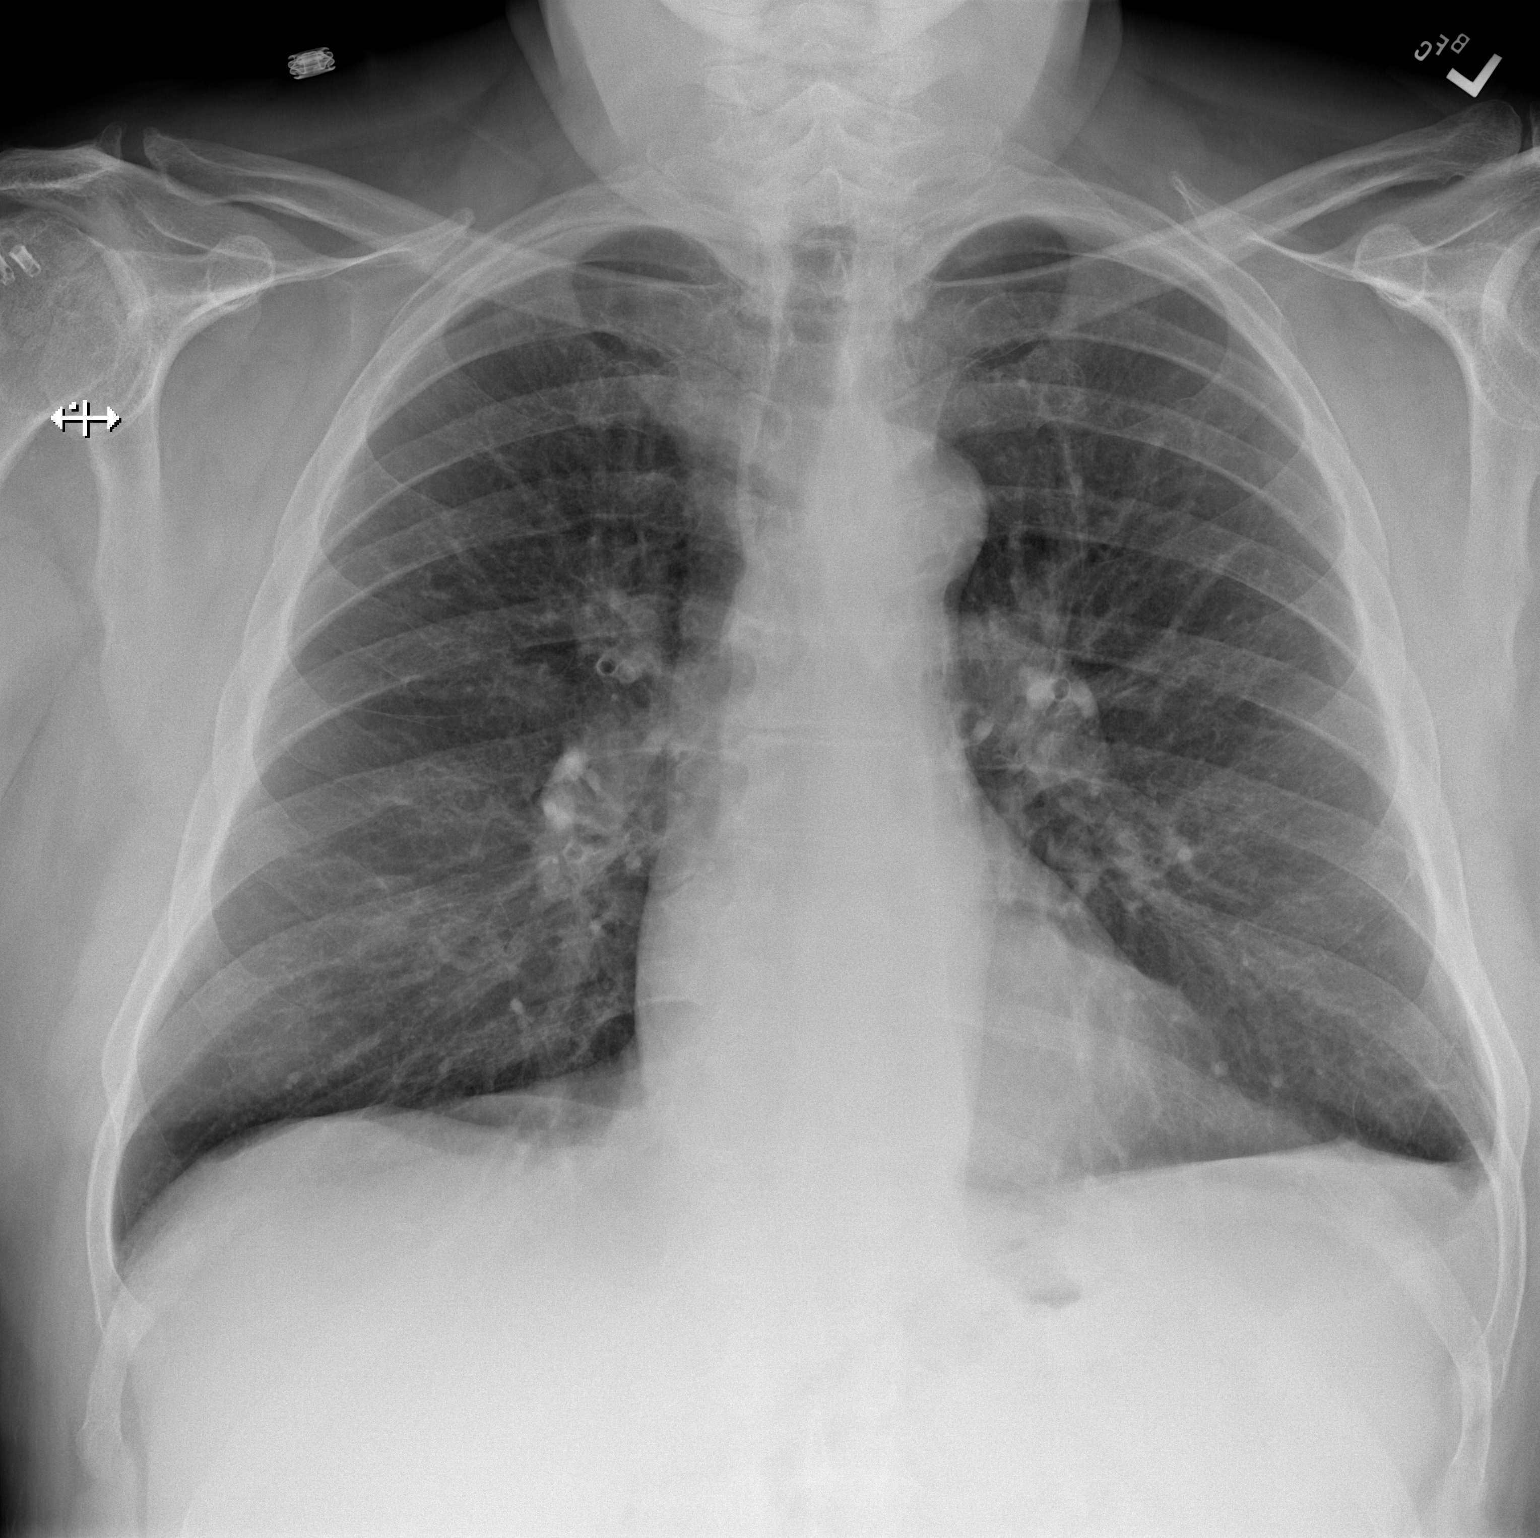

[w chest lat]
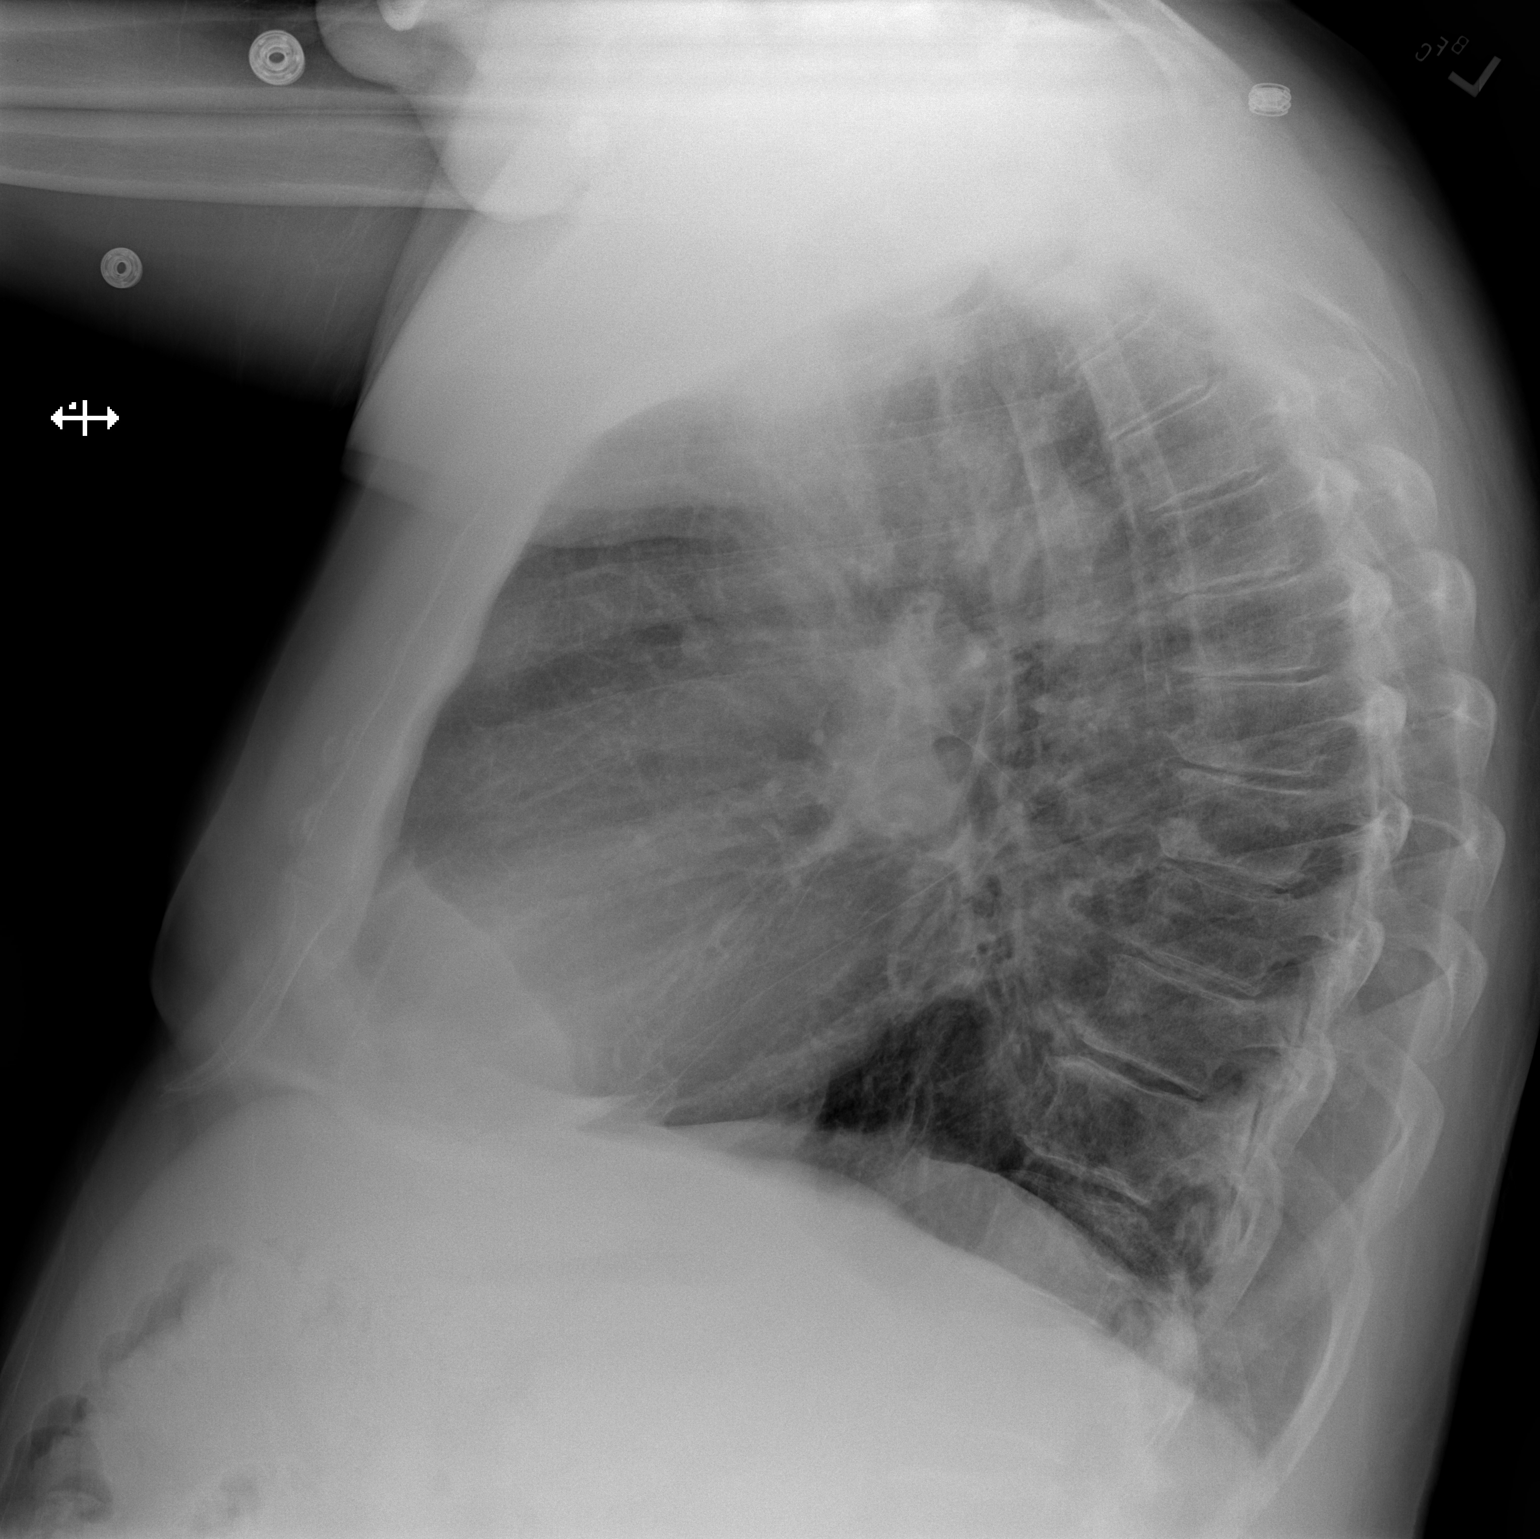

[2 of 2 positions shown; findings below may reference images not displayed]

FINDINGS: Cardiomediastinal silhouette is normal. Increased lung volumes with
flattened hemidiaphragms. Minimal LEFT lung base pleural thickening
and scarring. No pleural effusion or focal consolidation. No
pneumothorax. Mild degenerative change of the thoracic spine. Soft
tissue planes are nonsuspicious. Suture anchors RIGHT humeral head.
IMPRESSION: COPD.  No acute cardiopulmonary process.

## 2018-11-22 DIAGNOSIS — C44622 Squamous cell carcinoma of skin of right upper limb, including shoulder: Secondary | ICD-10-CM | POA: Diagnosis not present

## 2018-11-22 DIAGNOSIS — D485 Neoplasm of uncertain behavior of skin: Secondary | ICD-10-CM | POA: Diagnosis not present

## 2018-11-24 ENCOUNTER — Ambulatory Visit (INDEPENDENT_AMBULATORY_CARE_PROVIDER_SITE_OTHER): Payer: Medicare Other

## 2018-11-24 ENCOUNTER — Ambulatory Visit (INDEPENDENT_AMBULATORY_CARE_PROVIDER_SITE_OTHER): Payer: Medicare Other | Admitting: Family Medicine

## 2018-11-24 ENCOUNTER — Encounter: Payer: Self-pay | Admitting: Family Medicine

## 2018-11-24 VITALS — BP 136/62 | HR 67 | Temp 97.8°F | Ht 72.0 in | Wt 217.0 lb

## 2018-11-24 DIAGNOSIS — R222 Localized swelling, mass and lump, trunk: Secondary | ICD-10-CM | POA: Diagnosis not present

## 2018-11-24 DIAGNOSIS — M898X8 Other specified disorders of bone, other site: Secondary | ICD-10-CM

## 2018-11-24 NOTE — Assessment & Plan Note (Signed)
Unclear etiology, CXR ordered for further evaluation of area.

## 2018-11-24 NOTE — Patient Instructions (Signed)
We'll give you a call with xray results.

## 2018-11-24 NOTE — Progress Notes (Signed)
[name] - 68 y.o. male MRN [id number]  Date of birth: May 08, 1951  Subjective Chief Complaint  Patient presents with  . Other    noticed his sternum protrusion noticied it 5 days ago. Denies pain.     HPI [name] is a 68 y.o. male with history of COPD, HTN and OSA here today due to protrusion along lower sternum.  He first noticed this around 5 days ago.  He denies any pain associated with this.  He questions if it just may be more noticeable due to his weight loss.  He has not had increased shortness of breath, nausea or vomiting.  He denies previous injury or trauma to this area.   ROS:  A comprehensive ROS was completed and negative except as noted per HPI.  Allergies  Allergen Reactions  . Bupropion Other (See Comments)    Contraindicated - seizure in childhood (he has never taken medication, but we discussed it in [place] for smoking cessation) Contraindicated - seizure in childhood (he has never taken medication, but we discussed it in [place] for smoking cessation) Contraindicated - seizure in childhood (he has never taken medication, but we discussed it in [place] for smoking cessation)   . Codeine Itching  . Morphine And Related Itching    Past Medical History:  Diagnosis Date  . COPD (chronic obstructive pulmonary disease) ([place])   . Hypertension   . Lumbar stenosis   . Sleep apnea    Uses a cpap    Past Surgical History:  Procedure Laterality Date  . APPENDECTOMY    . Artery of leg     Bilateral repair  . JOINT REPLACEMENT    . LUMBAR LAMINECTOMY/DECOMPRESSION MICRODISCECTOMY N/A 02/17/2018   Procedure: Lumbar Two-Three, Lumbar Three-Four, Lumbar Four-Five Laminectomy and Foraminotomy;  Surgeon: Eustace Moore, MD;  Location: [facility];  Service: Neurosurgery;  Laterality: N/A;  . SHOULDER ARTHROSCOPY W/ ROTATOR CUFF REPAIR     Bilateral  . TOTAL HIP ARTHROPLASTY     bilateral    Social History   Socioeconomic History  . Marital status: Married    Spouse name:  Not on file  . Number of children: Not on file  . Years of education: Not on file  . Highest education level: Not on file  Occupational History  . Not on file  Social Needs  . Financial resource strain: Not on file  . Food insecurity:    Worry: Not on file    Inability: Not on file  . Transportation needs:    Medical: Not on file    Non-medical: Not on file  Tobacco Use  . Smoking status: Current Every Day Smoker    Packs/day: 1.00    Types: Cigarettes  . Smokeless tobacco: Never Used  Substance and Sexual Activity  . Alcohol use: Yes    Comment: occasional  . Drug use: Never  . Sexual activity: Not on file  Lifestyle  . Physical activity:    Days per week: Not on file    Minutes per session: Not on file  . Stress: Not on file  Relationships  . Social connections:    Talks on phone: Not on file    Gets together: Not on file    Attends religious service: Not on file    Active member of club or organization: Not on file    Attends meetings of clubs or organizations: Not on file    Relationship status: Not on file  Other Topics Concern  .  Not on file  Social History Narrative  . Not on file    Family History  Problem Relation Age of Onset  . Ovarian cancer Mother     Health Maintenance  Topic Date Due  . Hepatitis C Screening  1951/08/07  . TETANUS/TDAP  10/19/1968  . COLONOSCOPY  10/20/1999  . PNA vac Low Risk Adult (2 of 2 - PPSV23) 01/25/2018  . INFLUENZA VACCINE  12/27/2018 (Originally 04/28/2018)    ----------------------------------------------------------------------------------------------------------------------------------------------------------------------------------------------------------------- Physical Exam BP 136/62   Pulse 67   Temp 97.8 F (36.6 C) (Oral)   Ht 6' (1.829 m)   Wt 217 lb (98.4 kg)   SpO2 97%   BMI 29.43 kg/m   Physical Exam Constitutional:      Appearance: Normal appearance.  Eyes:     General: No scleral  icterus. Cardiovascular:     Rate and Rhythm: Normal rate and regular rhythm.  Pulmonary:     Effort: Pulmonary effort is normal.     Breath sounds: Normal breath sounds.     Comments: Nodular protrusion as distal sternum/xiphoid, more noticeable with laying down.  Non-tender.  Skin:    General: Skin is warm and dry.     Findings: No rash.  Neurological:     General: No focal deficit present.     Mental Status: He is alert.  Psychiatric:        Mood and Affect: Mood normal.        Behavior: Behavior normal.     ------------------------------------------------------------------------------------------------------------------------------------------------------------------------------------------------------------------- Assessment and Plan  Sternal mass Unclear etiology, CXR ordered for further evaluation of area.

## 2018-11-25 NOTE — Progress Notes (Signed)
No sternal mass is noted however in looking at the xray the xihpoid (cartilage piece at the end of the breastbone) appears to be elongated and hooks forward.  From what I have found this appears to be a normal variant.  If he notices it getting bigger or it becomes more bothersome we can get a CT scan of this.

## 2018-12-05 DIAGNOSIS — C44622 Squamous cell carcinoma of skin of right upper limb, including shoulder: Secondary | ICD-10-CM | POA: Diagnosis not present

## 2018-12-19 ENCOUNTER — Ambulatory Visit: Payer: Self-pay

## 2018-12-19 NOTE — Telephone Encounter (Signed)
Incoming call from Patient stating that he has Sx of having a cold. Onset was 3 days ago.  Has discharge only while on CPAP in the am.  Discharge is clear.  Denies any respiratory distress.  Temperature is 97.8.  Patient states overall he feels achy . Has been taking Day quill. Has helped.  Denies any other Sx, such as, sore throat, ear ache, and wheezing.  Provided care advice,voiced understanding  Did not want to make appointment at this time.  If Sx worsen will call back .        [name] Male, 68 y.o., 06-06-1951 MRN:  [id number] Phone:  [phone] Jerilynn Mages) PCP:  Luetta Nutting, DO Primary Cvg:  MEDICARE/MEDICARE PART A AND B Message from Nils Flack sent at 12/19/2018 8:07 AM EDT   Summary: home care advice   Pt is having head cold. He is taking dayquil and it helps but pt doesn't want it to settle in chest. Going on for 3 days. No fever, no sob. No dry cough Please call pt back and advise          Call History    Type Contact Phone  12/19/2018 08:06 AM Phone (Incoming) Dietrich, Samuelson (Self) [phone] (H)  User: Nils Flack    Reason for Disposition . [1] Sinus congestion (pressure, fullness) AND [2] present > 10 days  Answer Assessment - Initial Assessment Questions 1. ONSET: "When did the nasal discharge start?"      3 days  2. AMOUNT: "How much discharge is there?"      In am only,  clear 3. COUGH: "Do you have a cough?" If yes, ask: "Describe the color of your sputum" (clear, white, yellow, green)     clear 4. RESPIRATORY DISTRESS: "Describe your breathing."      denies 5. FEVER: "Do you have a fever?" If so, ask: "What is your temperature, how was it measured, and when did it start?"     97.8 6. SEVERITY: "Overall, how bad are you feeling right now?" (e.g., doesn't interfere with normal activities, staying home from school/work, staying in bed)      Achy, dayquil 7. OTHER SYMPTOMS: "Do you have any other symptoms?" (e.g., sore throat,  earache, wheezing, vomiting)     denies 8. PREGNANCY: "Is there any chance you are pregnant?" "When was your last menstrual period?"     Na  Protocols used: COMMON COLD-A-AH

## 2018-12-23 NOTE — Telephone Encounter (Signed)
Message from Denver Faster sent at 12/23/2018 5:30 PM EDT   Summary: home care advice   This pt called back and he is still having the same kind thing going on today. No fever, stilling blowing clear mucus. He is feeling tired but nothing else , he would like to know what he can take or what he needs to do. He has tried [facility], Dayguil , and tylenol .           Pt has a lot of mucous in the morning only. It is clear. Denies having a fever.  Temp has been 97.6 every day since last week. Taking the meds listed above. Taking hydrated, drinking three 20 ounces if water every day.  Advised to also try the saline washings to clean out his nasal passages but also will dry up some of the mucous and not make him too dry.  And advised to call back for fever or increase respiratory symptoms. Pt voiced understanding. Routing to flow at [facility] at [facility].

## 2018-12-26 NOTE — Telephone Encounter (Signed)
Recommend using flonase consistently each day, may take 7-10 days to notice a difference with this.  Suggest addition of OTC antihistamine as well if not already taking cetirizine, fexofenadine or loratadine.  Let me know if still not improving with these.  Thanks!

## 2018-12-26 NOTE — Telephone Encounter (Signed)
Called Pt . He was doing better. Pt stated he "followed nurses directions with Flonase. Thanks for taking such good care of me !" " Have a good day " . Informed Pt that today is a high pollen count day and if needed to try OTC allergy meds. Pt verbalized his understanding.

## 2018-12-26 NOTE — Telephone Encounter (Signed)
Please advise 

## 2018-12-30 ENCOUNTER — Encounter: Payer: Self-pay | Admitting: Family Medicine

## 2018-12-30 ENCOUNTER — Ambulatory Visit (INDEPENDENT_AMBULATORY_CARE_PROVIDER_SITE_OTHER): Payer: Medicare Other | Admitting: Family Medicine

## 2018-12-30 DIAGNOSIS — J302 Other seasonal allergic rhinitis: Secondary | ICD-10-CM | POA: Insufficient documentation

## 2018-12-30 MED ORDER — PROMETHAZINE-DM 6.25-15 MG/5ML PO SYRP: 5.0000 mL | ORAL_SOLUTION | Freq: Four times a day (QID) | ORAL | 1 refills | Status: DC | PRN

## 2018-12-30 MED ORDER — AZELASTINE HCL 0.1 % NA SOLN: 2.0000 | Freq: Two times a day (BID) | NASAL | 12 refills | Status: DC

## 2018-12-30 NOTE — Progress Notes (Signed)
[name] - 68 y.o. male MRN [id number]  Date of birth: 06/07/1951   This visit type was conducted due to national recommendations for restrictions regarding the COVID-19 Pandemic (e.g. social distancing).  This format is felt to be most appropriate for this patient at this time.  All issues noted in this document were discussed and addressed.  No physical exam was performed (except for noted visual exam findings with Video Visits).  I discussed the limitations of evaluation and management by telemedicine and the availability of in person appointments. The patient expressed understanding and agreed to proceed.  I connected with@ on 12/30/18 at  2:30 PM EDT by a video enabled telemedicine application and verified that I am speaking with the correct person using two identifiers.   Patient Location: Patients home [address] [place] [address]   Provider location:   Home clinic  Chief Complaint  Patient presents with  . Allergic Rhinitis     Onset 6-7 days    HPI  [name] is a 68 y.o. male who presents via audio/video conferencing for a telehealth visit today.  He has complaint of rhinitis and mild cough.  He reports symptoms started about 6-7 days ago.  He has been using flonase and dayquil which has provided some relief.  He has had cough, mainly in the morning that it productive of clear mucus.  He denies fever, chills, chest pain, shortness of breath, headache or sinus pain.  He has an old rx of promethazine-dm that helps his cough.    ROS:  A comprehensive ROS was completed and negative except as noted per HPI  Past Medical History:  Diagnosis Date  . COPD (chronic obstructive pulmonary disease) ([place])   . Hypertension   . Lumbar stenosis   . Sleep apnea    Uses a cpap    Past Surgical History:  Procedure Laterality Date  . APPENDECTOMY    . Artery of leg     Bilateral repair  . JOINT REPLACEMENT    . LUMBAR LAMINECTOMY/DECOMPRESSION MICRODISCECTOMY N/A  02/17/2018   Procedure: Lumbar Two-Three, Lumbar Three-Four, Lumbar Four-Five Laminectomy and Foraminotomy;  Surgeon: Eustace Moore, MD;  Location: [facility];  Service: Neurosurgery;  Laterality: N/A;  . SHOULDER ARTHROSCOPY W/ ROTATOR CUFF REPAIR     Bilateral  . TOTAL HIP ARTHROPLASTY     bilateral    Family History  Problem Relation Age of Onset  . Ovarian cancer Mother     Social History   Socioeconomic History  . Marital status: Married    Spouse name: Not on file  . Number of children: Not on file  . Years of education: Not on file  . Highest education level: Not on file  Occupational History  . Not on file  Social Needs  . Financial resource strain: Not on file  . Food insecurity:    Worry: Not on file    Inability: Not on file  . Transportation needs:    Medical: Not on file    Non-medical: Not on file  Tobacco Use  . Smoking status: Current Every Day Smoker    Packs/day: 1.00    Types: Cigarettes  . Smokeless tobacco: Never Used  Substance and Sexual Activity  . Alcohol use: Yes    Comment: occasional  . Drug use: Never  . Sexual activity: Not on file  Lifestyle  . Physical activity:    Days per week: Not on file    Minutes per session:  Not on file  . Stress: Not on file  Relationships  . Social connections:    Talks on phone: Not on file    Gets together: Not on file    Attends religious service: Not on file    Active member of club or organization: Not on file    Attends meetings of clubs or organizations: Not on file    Relationship status: Not on file  . Intimate partner violence:    Fear of current or ex partner: Not on file    Emotionally abused: Not on file    Physically abused: Not on file    Forced sexual activity: Not on file  Other Topics Concern  . Not on file  Social History Narrative  . Not on file     Current Outpatient Medications:  .  acetaminophen (TYLENOL) 650 MG CR tablet, Take 1,300 mg by mouth daily., Disp: , Rfl:  .   albuterol (PROVENTIL HFA) 108 (90 Base) MCG/ACT inhaler, USE 2 INHALATIONS EVERY 6 HOURS AS NEEDED FOR WHEEZING, Disp: , Rfl:  .  ALPRAZolam (XANAX) 0.5 MG tablet, Take 0.5 mg by mouth at bedtime as needed for sleep., Disp: , Rfl: 1 .  aspirin 81 MG chewable tablet, Chew by mouth., Disp: , Rfl:  .  atorvastatin (LIPITOR) 10 MG tablet, Take 10 mg by mouth at bedtime., Disp: , Rfl:  .  Cholecalciferol (VITAMIN D) 2000 units tablet, Take 2,000 Units by mouth daily., Disp: , Rfl:  .  gabapentin (NEURONTIN) 300 MG capsule, Take 300 mg by mouth 3 (three) times daily., Disp: , Rfl:  .  glucosamine-chondroitin 500-400 MG tablet, Take by mouth., Disp: , Rfl:  .  hydrochlorothiazide (HYDRODIURIL) 25 MG tablet, Take 25 mg by mouth daily., Disp: , Rfl:  .  losartan (COZAAR) 50 MG tablet, Take 50 mg by mouth daily., Disp: , Rfl:  .  Multiple Vitamin (MULTIVITAMIN WITH MINERALS) TABS tablet, Take 1 tablet by mouth daily., Disp: , Rfl:  .  Naproxen Sodium (ALEVE) 220 MG CAPS, Take by mouth., Disp: , Rfl:  .  Omega-3 Fatty Acids (FISH OIL PO), Take 1 capsule by mouth daily., Disp: , Rfl:  .  Potassium 99 MG TABS, Take 99 mg by mouth daily., Disp: , Rfl:  .  potassium gluconate (RA POTASSIUM GLUCONATE) 595 (99 K) MG TABS tablet, Take by mouth., Disp: , Rfl:  .  tiotropium (SPIRIVA HANDIHALER) 18 MCG inhalation capsule, Place into inhaler and inhale., Disp: , Rfl:  .  Vitamins/Minerals TABS, Take by mouth., Disp: , Rfl:  .  azelastine (ASTELIN) 0.1 % nasal spray, Place 2 sprays into both nostrils 2 (two) times daily. Use in each nostril as directed, Disp: 30 mL, Rfl: 12 .  promethazine-dextromethorphan (PROMETHAZINE-DM) 6.25-15 MG/5ML syrup, Take 5 mLs by mouth 4 (four) times daily as needed for cough., Disp: 200 mL, Rfl: 1  EXAM:  VITALS per patient if applicable: BP 326/71   Temp 98.7 F (37.1 C) (Oral) Comment: took a few days ago,at home  Wt 210 lb (95.3 kg) Comment: Pt compeleted at home  BMI 28.48 kg/m    GENERAL: alert, oriented, appears well and in no acute distress  HEENT: atraumatic, conjunttiva clear, no obvious abnormalities on inspection of external nose and ears  NECK: normal movements of the head and neck  LUNGS: on inspection no signs of respiratory distress, breathing rate appears normal, no obvious gross SOB, gasping or wheezing  CV: no obvious cyanosis  MS: moves all  visible extremities without noticeable abnormality  PSYCH/NEURO: pleasant and cooperative, no obvious depression or anxiety, speech and thought processing grossly intact  ASSESSMENT AND PLAN:  Discussed the following assessment and plan:  Seasonal allergies -New problem.  -Symptoms consistent with cough due to post nasal drainage, likely from allergic rhinitis.   -Will change flonase to astelin to see if this provides better relief.  -Recommend adding oral antihistamine as well.  -Discussed trying atrovent nasal spray if no response to astelin.  -Refill of promethazine-dm provided.        I discussed the assessment and treatment plan with the patient. The patient was provided an opportunity to ask questions and all were answered. The patient agreed with the plan and demonstrated an understanding of the instructions.   The patient was advised to call back or seek an in-person evaluation if the symptoms worsen or if the condition fails to improve as anticipated.     Luetta Nutting, DO

## 2018-12-30 NOTE — Assessment & Plan Note (Addendum)
-  New problem.  -Symptoms consistent with cough due to post nasal drainage, likely from allergic rhinitis.   -Will change flonase to astelin to see if this provides better relief.  -Recommend adding oral antihistamine as well.  -Discussed trying atrovent nasal spray if no response to astelin.  -Refill of promethazine-dm provided.

## 2018-12-30 NOTE — Telephone Encounter (Signed)
Pt has scheduled appt. 12/30/2018. Task completed. JAP

## 2018-12-30 NOTE — Telephone Encounter (Signed)
I spoke with patient. He is still having congestion and the flonase is not helping nor is the dayquil. He was working out in the yard again yesterday and the congestion came back. I offered him a video visit with Dr. Zigmund Daniel, but he wants to see if he can just have something called in.

## 2019-01-05 ENCOUNTER — Ambulatory Visit: Payer: Self-pay | Admitting: *Deleted

## 2019-01-05 NOTE — Telephone Encounter (Signed)
Patient is calling to report he has fever- 100.2- 98.7. Patient is using an era thermometer and he is getting all kinds of readings. Discussed the proper way to use the thermometer and explained that he is going to get improper readings if not used and inserted into the ear properly.  Patient reports symptoms are all in his head- sinus are affected. Patient reports no coughing- no respiratory symptoms- he is doing well. He thinks he is being over anxious.  Explained treatment of symptoms at home. Discussed not doing things to aggravate his symptoms- riding motorcycle, working in yard with all this pollen. He understands and he will call back with any changes at all.   Reason for Disposition . [1] Fever AND [2] no signs of serious infection or localizing symptoms (all other triage questions negative)  Answer Assessment - Initial Assessment Questions 1. TEMPERATURE: "What is the most recent temperature?"  "How was it measured?"      100.2- 98.7- same time today with ear thermometer 2. ONSET: "When did the fever start?"      today 3. SYMPTOMS: "Do you have any other symptoms besides the fever?"  (e.g., colds, headache, sore throat, earache, cough, rash, diarrhea, vomiting, abdominal pain)     Sinus congestion, cough only in morning from CPAP 4. CAUSE: If there are no symptoms, ask: "What do you think is causing the fever?"      Patient is not sure he has temperature- he is getting normal reading when using thermometer properly 5. CONTACTS: "Does anyone else in the family have an infection?"     no 6. TREATMENT: "What have you done so far to treat this fever?" (e.g., medications)     Nothing- will increase fluids as instructed by PCP 7. IMMUNOCOMPROMISE: "Do you have of the following: diabetes, HIV positive, splenectomy, cancer chemotherapy, chronic steroid treatment, transplant patient, etc."     COPD 8. PREGNANCY: "Is there any chance you are pregnant?" "When was your last menstrual period?"  n/a  Protocols used: FEVER-A-AH

## 2019-01-07 NOTE — Telephone Encounter (Signed)
Called Pt . LAM Just checking on Pt.

## 2019-01-20 DIAGNOSIS — E782 Mixed hyperlipidemia: Secondary | ICD-10-CM | POA: Diagnosis not present

## 2019-01-20 DIAGNOSIS — R7301 Impaired fasting glucose: Secondary | ICD-10-CM | POA: Diagnosis not present

## 2019-01-20 DIAGNOSIS — Z125 Encounter for screening for malignant neoplasm of prostate: Secondary | ICD-10-CM | POA: Diagnosis not present

## 2019-01-24 DIAGNOSIS — F5101 Primary insomnia: Secondary | ICD-10-CM | POA: Diagnosis not present

## 2019-01-24 DIAGNOSIS — M5416 Radiculopathy, lumbar region: Secondary | ICD-10-CM | POA: Diagnosis not present

## 2019-01-24 DIAGNOSIS — J432 Centrilobular emphysema: Secondary | ICD-10-CM | POA: Diagnosis not present

## 2019-01-24 DIAGNOSIS — J449 Chronic obstructive pulmonary disease, unspecified: Secondary | ICD-10-CM | POA: Diagnosis not present

## 2019-01-24 DIAGNOSIS — F17200 Nicotine dependence, unspecified, uncomplicated: Secondary | ICD-10-CM | POA: Diagnosis not present

## 2019-01-24 DIAGNOSIS — Z125 Encounter for screening for malignant neoplasm of prostate: Secondary | ICD-10-CM | POA: Diagnosis not present

## 2019-01-24 DIAGNOSIS — Z0000 Encounter for general adult medical examination without abnormal findings: Secondary | ICD-10-CM | POA: Diagnosis not present

## 2019-01-24 DIAGNOSIS — E782 Mixed hyperlipidemia: Secondary | ICD-10-CM | POA: Diagnosis not present

## 2019-01-24 DIAGNOSIS — I10 Essential (primary) hypertension: Secondary | ICD-10-CM | POA: Diagnosis not present

## 2019-01-24 DIAGNOSIS — R7301 Impaired fasting glucose: Secondary | ICD-10-CM | POA: Diagnosis not present

## 2019-01-24 DIAGNOSIS — I739 Peripheral vascular disease, unspecified: Secondary | ICD-10-CM | POA: Diagnosis not present

## 2019-01-25 DIAGNOSIS — M1812 Unilateral primary osteoarthritis of first carpometacarpal joint, left hand: Secondary | ICD-10-CM | POA: Diagnosis not present

## 2019-01-30 ENCOUNTER — Other Ambulatory Visit: Payer: Self-pay

## 2019-02-09 DIAGNOSIS — L570 Actinic keratosis: Secondary | ICD-10-CM | POA: Diagnosis not present

## 2019-02-09 DIAGNOSIS — L821 Other seborrheic keratosis: Secondary | ICD-10-CM | POA: Diagnosis not present

## 2019-02-09 DIAGNOSIS — L814 Other melanin hyperpigmentation: Secondary | ICD-10-CM | POA: Diagnosis not present

## 2019-02-09 DIAGNOSIS — L579 Skin changes due to chronic exposure to nonionizing radiation, unspecified: Secondary | ICD-10-CM | POA: Diagnosis not present

## 2019-02-09 DIAGNOSIS — D485 Neoplasm of uncertain behavior of skin: Secondary | ICD-10-CM | POA: Diagnosis not present

## 2019-02-09 DIAGNOSIS — L281 Prurigo nodularis: Secondary | ICD-10-CM | POA: Diagnosis not present

## 2019-02-09 DIAGNOSIS — D225 Melanocytic nevi of trunk: Secondary | ICD-10-CM | POA: Diagnosis not present

## 2019-02-09 DIAGNOSIS — Z85828 Personal history of other malignant neoplasm of skin: Secondary | ICD-10-CM | POA: Diagnosis not present

## 2019-03-09 DIAGNOSIS — M7742 Metatarsalgia, left foot: Secondary | ICD-10-CM | POA: Diagnosis not present

## 2019-03-09 DIAGNOSIS — M779 Enthesopathy, unspecified: Secondary | ICD-10-CM | POA: Diagnosis not present

## 2019-03-09 DIAGNOSIS — M792 Neuralgia and neuritis, unspecified: Secondary | ICD-10-CM | POA: Diagnosis not present

## 2019-03-29 DIAGNOSIS — M1810 Unilateral primary osteoarthritis of first carpometacarpal joint, unspecified hand: Secondary | ICD-10-CM | POA: Insufficient documentation

## 2019-04-04 DIAGNOSIS — Z1159 Encounter for screening for other viral diseases: Secondary | ICD-10-CM | POA: Diagnosis not present

## 2019-04-04 DIAGNOSIS — M1388 Other specified arthritis, other site: Secondary | ICD-10-CM | POA: Diagnosis not present

## 2019-04-04 DIAGNOSIS — Z01812 Encounter for preprocedural laboratory examination: Secondary | ICD-10-CM | POA: Diagnosis not present

## 2019-04-07 DIAGNOSIS — Z96611 Presence of right artificial shoulder joint: Secondary | ICD-10-CM | POA: Diagnosis not present

## 2019-04-07 DIAGNOSIS — I10 Essential (primary) hypertension: Secondary | ICD-10-CM | POA: Diagnosis not present

## 2019-04-07 DIAGNOSIS — Z974 Presence of external hearing-aid: Secondary | ICD-10-CM | POA: Diagnosis not present

## 2019-04-07 DIAGNOSIS — Z96612 Presence of left artificial shoulder joint: Secondary | ICD-10-CM | POA: Diagnosis not present

## 2019-04-07 DIAGNOSIS — I77811 Abdominal aortic ectasia: Secondary | ICD-10-CM | POA: Diagnosis not present

## 2019-04-07 DIAGNOSIS — J449 Chronic obstructive pulmonary disease, unspecified: Secondary | ICD-10-CM | POA: Diagnosis not present

## 2019-04-07 DIAGNOSIS — G4733 Obstructive sleep apnea (adult) (pediatric): Secondary | ICD-10-CM | POA: Diagnosis not present

## 2019-04-07 DIAGNOSIS — H9193 Unspecified hearing loss, bilateral: Secondary | ICD-10-CM | POA: Diagnosis not present

## 2019-04-07 DIAGNOSIS — Z96643 Presence of artificial hip joint, bilateral: Secondary | ICD-10-CM | POA: Diagnosis not present

## 2019-04-07 DIAGNOSIS — E785 Hyperlipidemia, unspecified: Secondary | ICD-10-CM | POA: Diagnosis not present

## 2019-04-07 DIAGNOSIS — G8918 Other acute postprocedural pain: Secondary | ICD-10-CM | POA: Diagnosis not present

## 2019-04-07 DIAGNOSIS — M1812 Unilateral primary osteoarthritis of first carpometacarpal joint, left hand: Secondary | ICD-10-CM | POA: Diagnosis not present

## 2019-04-07 DIAGNOSIS — Z9989 Dependence on other enabling machines and devices: Secondary | ICD-10-CM | POA: Diagnosis not present

## 2019-04-14 DIAGNOSIS — Z4789 Encounter for other orthopedic aftercare: Secondary | ICD-10-CM | POA: Diagnosis not present

## 2019-04-14 DIAGNOSIS — M79642 Pain in left hand: Secondary | ICD-10-CM | POA: Diagnosis not present

## 2019-04-14 DIAGNOSIS — Z9889 Other specified postprocedural states: Secondary | ICD-10-CM | POA: Diagnosis not present

## 2019-04-17 DIAGNOSIS — Z471 Aftercare following joint replacement surgery: Secondary | ICD-10-CM | POA: Diagnosis not present

## 2019-04-17 DIAGNOSIS — M79645 Pain in left finger(s): Secondary | ICD-10-CM | POA: Diagnosis not present

## 2019-04-17 DIAGNOSIS — Z96692 Finger-joint replacement of left hand: Secondary | ICD-10-CM | POA: Diagnosis not present

## 2019-04-25 ENCOUNTER — Ambulatory Visit (INDEPENDENT_AMBULATORY_CARE_PROVIDER_SITE_OTHER): Payer: Medicare Other | Admitting: Family Medicine

## 2019-04-25 ENCOUNTER — Encounter: Payer: Self-pay | Admitting: Family Medicine

## 2019-04-25 ENCOUNTER — Other Ambulatory Visit: Payer: Self-pay

## 2019-04-25 VITALS — BP 138/60 | HR 92 | Temp 98.9°F | Resp 20 | Ht 72.0 in | Wt 217.2 lb

## 2019-04-25 DIAGNOSIS — I739 Peripheral vascular disease, unspecified: Secondary | ICD-10-CM | POA: Diagnosis not present

## 2019-04-25 DIAGNOSIS — I10 Essential (primary) hypertension: Secondary | ICD-10-CM

## 2019-04-25 NOTE — Assessment & Plan Note (Signed)
-  BP well controlled, continue current medication.

## 2019-04-25 NOTE — Progress Notes (Signed)
[name] - 68 y.o. male MRN [id number]  Date of birth: 07-16-1951  Subjective Chief Complaint  Patient presents with  . Leg Pain    R calf pain on ambluation ,     HPI [name] is a 68 y.o. male with history of HTN, COPD, PVD, and OSA here today with complaint of intermittent claudication of the L calf.  He reports that he first noticed this a few weeks ago and feels similar to before he had aortofemoral bypass.  Cramping is worse when first walking then improves slightly with worsening if walking long distances.  He denies any color change of the foot, numbness or tingling.  He has unfortunately starting smoking more again.    ROS:  A comprehensive ROS was completed and negative except as noted per HPI  Allergies  Allergen Reactions  . Bupropion Other (See Comments)    Contraindicated - seizure in childhood (he has never taken medication, but we discussed it in [facility] for smoking cessation) Contraindicated - seizure in childhood (he has never taken medication, but we discussed it in [place] for smoking cessation) Contraindicated - seizure in childhood (he has never taken medication, but we discussed it in [facility] for smoking cessation)   . Codeine Itching  . Morphine And Related Itching    Past Medical History:  Diagnosis Date  . COPD (chronic obstructive pulmonary disease) ([place])   . Hypertension   . Lumbar stenosis   . Sleep apnea    Uses a cpap    Past Surgical History:  Procedure Laterality Date  . APPENDECTOMY    . Artery of leg     Bilateral repair  . JOINT REPLACEMENT    . LUMBAR LAMINECTOMY/DECOMPRESSION MICRODISCECTOMY N/A 02/17/2018   Procedure: Lumbar Two-Three, Lumbar Three-Four, Lumbar Four-Five Laminectomy and Foraminotomy;  Surgeon: Eustace Moore, MD;  Location: [facility];  Service: Neurosurgery;  Laterality: N/A;  . SHOULDER ARTHROSCOPY W/ ROTATOR CUFF REPAIR     Bilateral  . TOTAL HIP ARTHROPLASTY     bilateral    Social History   Socioeconomic History  .  Marital status: Married    Spouse name: Not on file  . Number of children: Not on file  . Years of education: Not on file  . Highest education level: Not on file  Occupational History  . Not on file  Social Needs  . Financial resource strain: Not on file  . Food insecurity    Worry: Not on file    Inability: Not on file  . Transportation needs    Medical: Not on file    Non-medical: Not on file  Tobacco Use  . Smoking status: Current Every Day Smoker    Packs/day: 1.00    Types: Cigarettes  . Smokeless tobacco: Never Used  Substance and Sexual Activity  . Alcohol use: Yes    Comment: occasional  . Drug use: Never  . Sexual activity: Not on file  Lifestyle  . Physical activity    Days per week: Not on file    Minutes per session: Not on file  . Stress: Not on file  Relationships  . Social Herbalist on phone: Not on file    Gets together: Not on file    Attends religious service: Not on file    Active member of club or organization: Not on file    Attends meetings of clubs or organizations: Not on file    Relationship status: Not on file  Other Topics Concern  . Not on file  Social History Narrative  . Not on file    Family History  Problem Relation Age of Onset  . Ovarian cancer Mother     Health Maintenance  Topic Date Due  . Hepatitis C Screening  1951-01-21  . TETANUS/TDAP  10/19/1968  . COLONOSCOPY  10/20/1999  . PNA vac Low Risk Adult (2 of 2 - PPSV23) 01/25/2018  . INFLUENZA VACCINE  04/29/2019    ----------------------------------------------------------------------------------------------------------------------------------------------------------------------------------------------------------------- Physical Exam BP 138/60   Pulse 92   Temp 98.9 F (37.2 C) (Oral)   Resp 20   Ht 6' (1.829 m)   Wt 217 lb 3.2 oz (98.5 kg)   SpO2 91%   BMI 29.46 kg/m   Physical Exam Constitutional:      Appearance: Normal appearance.  HENT:      Head: Normocephalic and atraumatic.     Mouth/Throat:     Mouth: Mucous membranes are moist.  Eyes:     General: No scleral icterus. Neck:     Musculoskeletal: Neck supple.  Cardiovascular:     Rate and Rhythm: Normal rate and regular rhythm.     Comments: DP and PT pulses palpable but diminished bilaterally L>R.   Pulmonary:     Effort: Pulmonary effort is normal.     Breath sounds: Normal breath sounds.  Skin:    General: Skin is warm and dry.  Neurological:     Mental Status: He is alert.  Psychiatric:        Mood and Affect: Mood normal.        Behavior: Behavior normal.     ------------------------------------------------------------------------------------------------------------------------------------------------------------------------------------------------------------------- Assessment and Plan  PVD (peripheral vascular disease) ([place]) -Having intermittent claudication.  -ABI ordered.  Encouraged to continue frequent walking.   Benign hypertension -BP well controlled, continue current medication.

## 2019-04-25 NOTE — Assessment & Plan Note (Addendum)
-  Having intermittent claudication.  -ABI ordered.  Encouraged to continue frequent walking.

## 2019-04-26 ENCOUNTER — Telehealth (HOSPITAL_COMMUNITY): Payer: Self-pay | Admitting: *Deleted

## 2019-04-26 NOTE — Telephone Encounter (Signed)
The above patient or their representative was contacted and gave the following answers to these questions:         Do you have any of the following symptoms?  no  Fever                    Cough                   Shortness of breath  Do  you have any of the following other symptoms? no   muscle pain         vomiting,        diarrhea        rash         weakness        red eye        abdominal pain         bruising          bruising or bleeding              joint pain           severe headache    Have you been in contact with someone who was or has been sick in the past 2 weeks? no  Yes                 Unsure                         Unable to assess   Does the person that you were in contact with have any of the following symptoms?   Cough         shortness of breath           muscle pain         vomiting,            diarrhea            rash            weakness           fever            red eye           abdominal pain           bruising  or  bleeding                joint pain                severe headache               Have you  or someone you have been in contact with traveled internationally in th last month?         If yes, which countries?   Have you  or someone you have been in contact with traveled outside [place] in th last month?         If yes, which state and city?   COMMENTS OR ACTION PLAN FOR THIS PATIENT:

## 2019-04-28 ENCOUNTER — Ambulatory Visit (HOSPITAL_COMMUNITY)
Admission: RE | Admit: 2019-04-28 | Discharge: 2019-04-28 | Disposition: A | Discharge status: Home / Self Care | Payer: Medicare Other | Source: Ambulatory Visit | Attending: Family | Admitting: Family

## 2019-04-28 ENCOUNTER — Other Ambulatory Visit: Payer: Self-pay

## 2019-04-28 DIAGNOSIS — I739 Peripheral vascular disease, unspecified: Secondary | ICD-10-CM | POA: Diagnosis not present

## 2019-05-01 DIAGNOSIS — Z96692 Finger-joint replacement of left hand: Secondary | ICD-10-CM | POA: Diagnosis not present

## 2019-05-01 DIAGNOSIS — Z471 Aftercare following joint replacement surgery: Secondary | ICD-10-CM | POA: Diagnosis not present

## 2019-05-01 DIAGNOSIS — M79642 Pain in left hand: Secondary | ICD-10-CM | POA: Diagnosis not present

## 2019-05-03 DIAGNOSIS — L579 Skin changes due to chronic exposure to nonionizing radiation, unspecified: Secondary | ICD-10-CM | POA: Diagnosis not present

## 2019-05-03 DIAGNOSIS — D225 Melanocytic nevi of trunk: Secondary | ICD-10-CM | POA: Diagnosis not present

## 2019-05-03 DIAGNOSIS — L821 Other seborrheic keratosis: Secondary | ICD-10-CM | POA: Diagnosis not present

## 2019-05-03 DIAGNOSIS — Z85828 Personal history of other malignant neoplasm of skin: Secondary | ICD-10-CM | POA: Diagnosis not present

## 2019-05-03 DIAGNOSIS — L814 Other melanin hyperpigmentation: Secondary | ICD-10-CM | POA: Diagnosis not present

## 2019-05-09 DIAGNOSIS — Z471 Aftercare following joint replacement surgery: Secondary | ICD-10-CM | POA: Diagnosis not present

## 2019-05-09 DIAGNOSIS — Z96692 Finger-joint replacement of left hand: Secondary | ICD-10-CM | POA: Diagnosis not present

## 2019-05-09 DIAGNOSIS — M79642 Pain in left hand: Secondary | ICD-10-CM | POA: Diagnosis not present

## 2019-06-01 DIAGNOSIS — Z9889 Other specified postprocedural states: Secondary | ICD-10-CM | POA: Diagnosis not present

## 2019-06-01 DIAGNOSIS — Z96692 Finger-joint replacement of left hand: Secondary | ICD-10-CM | POA: Diagnosis not present

## 2019-07-05 DIAGNOSIS — R0600 Dyspnea, unspecified: Secondary | ICD-10-CM | POA: Diagnosis not present

## 2019-07-05 DIAGNOSIS — Z87891 Personal history of nicotine dependence: Secondary | ICD-10-CM | POA: Diagnosis not present

## 2019-07-05 DIAGNOSIS — J449 Chronic obstructive pulmonary disease, unspecified: Secondary | ICD-10-CM | POA: Diagnosis not present

## 2019-07-05 DIAGNOSIS — Z9889 Other specified postprocedural states: Secondary | ICD-10-CM | POA: Diagnosis not present

## 2019-07-05 DIAGNOSIS — G4733 Obstructive sleep apnea (adult) (pediatric): Secondary | ICD-10-CM | POA: Diagnosis not present

## 2019-07-10 DIAGNOSIS — Z87891 Personal history of nicotine dependence: Secondary | ICD-10-CM | POA: Diagnosis not present

## 2019-07-14 DIAGNOSIS — R911 Solitary pulmonary nodule: Secondary | ICD-10-CM | POA: Diagnosis not present

## 2019-07-17 ENCOUNTER — Encounter: Payer: Self-pay | Admitting: Family Medicine

## 2019-07-18 DIAGNOSIS — J449 Chronic obstructive pulmonary disease, unspecified: Secondary | ICD-10-CM | POA: Diagnosis not present

## 2019-07-18 DIAGNOSIS — R911 Solitary pulmonary nodule: Secondary | ICD-10-CM | POA: Diagnosis not present

## 2019-07-18 DIAGNOSIS — Z87891 Personal history of nicotine dependence: Secondary | ICD-10-CM | POA: Diagnosis not present

## 2019-07-18 DIAGNOSIS — G4733 Obstructive sleep apnea (adult) (pediatric): Secondary | ICD-10-CM | POA: Diagnosis not present

## 2019-07-20 ENCOUNTER — Telehealth: Payer: Self-pay

## 2019-07-20 NOTE — Telephone Encounter (Signed)
Questions for Screening COVID-19  Symptom onset: N/a  Travel or Contacts: No  During this illness, did/does the patient experience any of the following symptoms? Fever >100.4F []  Yes [x]  No []  Unknown Subjective fever (felt feverish) []  Yes [x]  No []  Unknown Chills []  Yes [x]  No []  Unknown Muscle aches (myalgia) []  Yes [x]  No []  Unknown Runny nose (rhinorrhea) []  Yes [x]  No []  Unknown Sore throat []  Yes [x]  No []  Unknown Cough (new onset or worsening of chronic cough) []  Yes [x]  No []  Unknown Shortness of breath (dyspnea) []  Yes [x]  No []  Unknown Nausea or vomiting []  Yes [x]  No []  Unknown Headache []  Yes [x]  No []  Unknown Abdominal pain  []  Yes [x]  No []  Unknown Diarrhea (?3 loose/looser than normal stools/24hr period) []  Yes [x]  No []  Unknown  

## 2019-07-21 ENCOUNTER — Encounter: Payer: Self-pay | Admitting: Family Medicine

## 2019-07-21 ENCOUNTER — Other Ambulatory Visit: Payer: Self-pay

## 2019-07-21 ENCOUNTER — Ambulatory Visit (INDEPENDENT_AMBULATORY_CARE_PROVIDER_SITE_OTHER): Payer: Medicare Other | Admitting: Family Medicine

## 2019-07-21 VITALS — BP 134/72 | HR 78 | Temp 98.0°F | Ht 72.0 in | Wt 225.8 lb

## 2019-07-21 DIAGNOSIS — I739 Peripheral vascular disease, unspecified: Secondary | ICD-10-CM

## 2019-07-21 DIAGNOSIS — R0781 Pleurodynia: Secondary | ICD-10-CM

## 2019-07-21 DIAGNOSIS — R918 Other nonspecific abnormal finding of lung field: Secondary | ICD-10-CM

## 2019-07-21 DIAGNOSIS — F17210 Nicotine dependence, cigarettes, uncomplicated: Secondary | ICD-10-CM | POA: Diagnosis not present

## 2019-07-21 MED ORDER — CHANTIX STARTING MONTH PAK 0.5 MG X 11 & 1 MG X 42 PO TABS: ORAL_TABLET | ORAL | 0 refills | Status: DC

## 2019-07-21 NOTE — Assessment & Plan Note (Signed)
-  Resolving, I don't think imaging is needed at this time.

## 2019-07-21 NOTE — Progress Notes (Signed)
[name] - 68 y.o. male MRN [id number]  Date of birth: 09-01-1951  Subjective Chief Complaint  Patient presents with  . Chest Pain    Pt hit his right side rib cage on his kitchen counter and its very painful. Pt will also like to discuss other things in private.    HPI [name] is a 68 y.o. male here today with complaint of rib pain.  He reports that he fell into his counter after tripping over his dog a few days ago.  Pain has improved.  He denies bruising and he is able to take a deep breath without difficulty.     He would also like to discuss recent PET-CT he had through his pulmonologist at [facility].  This shows a hypermetabolic lung nodule in the left lung apex with likely represents primary lung cancer.  He has been advised to have biopsy of this area but states he wanted to talk with me and see what my thoughts are.  He has quit smoking for the most part, down to about 1 cigarette per day.  He had good success with chantix previously and would like to try this again.    Also he reports that his claudication symptoms have improved with walking/stationary biking more.  ROS:  A comprehensive ROS was completed and negative except as noted per HPI  Allergies  Allergen Reactions  . Bupropion Other (See Comments)    Contraindicated - seizure in childhood (he has never taken medication, but we discussed it in [facility] for smoking cessation) Contraindicated - seizure in childhood (he has never taken medication, but we discussed it in [place] for smoking cessation) Contraindicated - seizure in childhood (he has never taken medication, but we discussed it in [facility] for smoking cessation)   . Codeine Itching  . Morphine And Related Itching    Past Medical History:  Diagnosis Date  . COPD (chronic obstructive pulmonary disease) ([place])   . Hypertension   . Lumbar stenosis   . Sleep apnea    Uses a cpap    Past Surgical History:  Procedure Laterality Date  . APPENDECTOMY    . Artery of  leg     Bilateral repair  . JOINT REPLACEMENT    . LUMBAR LAMINECTOMY/DECOMPRESSION MICRODISCECTOMY N/A 02/17/2018   Procedure: Lumbar Two-Three, Lumbar Three-Four, Lumbar Four-Five Laminectomy and Foraminotomy;  Surgeon: Eustace Moore, MD;  Location: [facility];  Service: Neurosurgery;  Laterality: N/A;  . SHOULDER ARTHROSCOPY W/ ROTATOR CUFF REPAIR     Bilateral  . TOTAL HIP ARTHROPLASTY     bilateral    Social History   Socioeconomic History  . Marital status: Married    Spouse name: Not on file  . Number of children: Not on file  . Years of education: Not on file  . Highest education level: Not on file  Occupational History  . Not on file  Social Needs  . Financial resource strain: Not on file  . Food insecurity    Worry: Not on file    Inability: Not on file  . Transportation needs    Medical: Not on file    Non-medical: Not on file  Tobacco Use  . Smoking status: Current Every Day Smoker    Packs/day: 0.25    Types: Cigarettes  . Smokeless tobacco: Never Used  Substance and Sexual Activity  . Alcohol use: Yes    Comment: occasional  . Drug use: Never  . Sexual activity: Not on file  Lifestyle  . Physical activity    Days per week: Not on file    Minutes per session: Not on file  . Stress: Not on file  Relationships  . Social Herbalist on phone: Not on file    Gets together: Not on file    Attends religious service: Not on file    Active member of club or organization: Not on file    Attends meetings of clubs or organizations: Not on file    Relationship status: Not on file  Other Topics Concern  . Not on file  Social History Narrative  . Not on file    Family History  Problem Relation Age of Onset  . Ovarian cancer Mother     Health Maintenance  Topic Date Due  . Hepatitis C Screening  1951-01-03  . TETANUS/TDAP  10/19/1968  . COLONOSCOPY  10/20/1999  . PNA vac Low Risk Adult (2 of 2 - PPSV23) 01/25/2018  . INFLUENZA VACCINE  12/27/2019  (Originally 04/29/2019)    ----------------------------------------------------------------------------------------------------------------------------------------------------------------------------------------------------------------- Physical Exam BP 134/72   Pulse 78   Temp 98 F (36.7 C) (Oral)   Ht 6' (1.829 m)   Wt 225 lb 12.8 oz (102.4 kg)   SpO2 97%   BMI 30.62 kg/m   Physical Exam Constitutional:      Appearance: He is well-developed.  HENT:     Head: Normocephalic and atraumatic.  Eyes:     General: No scleral icterus. Cardiovascular:     Rate and Rhythm: Normal rate and regular rhythm.  Pulmonary:     Effort: Pulmonary effort is normal.     Breath sounds: Normal breath sounds.  Chest:     Chest wall: No tenderness.  Skin:    General: Skin is warm and dry.  Neurological:     General: No focal deficit present.     Mental Status: He is alert.  Psychiatric:        Mood and Affect: Mood normal.        Behavior: Behavior normal.     ------------------------------------------------------------------------------------------------------------------------------------------------------------------------------------------------------------------- Assessment and Plan  Cigarette nicotine dependence without complication He has had variable success with quitting in the past.  Successful for a while with Chantix and would like to try again. Rx sent.    Mass of left lung -Discussed that findings on PET-CT showed area that is consistent with cancer but biopsy would be needed to tell which type and how treatable this would be.  Discussed with this that it is reassuring that PET scan did not show any additional areas that are concerning for metastases.    Rib pain on right side -Resolving, I don't think imaging is needed at this time.   PVD (peripheral vascular disease) ([employer]) -Claudication symptoms have improved since reduction in smoking and starting a walking program.   Encouraged to quit smoking and continue walking.

## 2019-07-21 NOTE — Assessment & Plan Note (Signed)
-  Claudication symptoms have improved since reduction in smoking and starting a walking program.  Encouraged to quit smoking and continue walking.

## 2019-07-21 NOTE — Assessment & Plan Note (Signed)
-  Discussed that findings on PET-CT showed area that is consistent with cancer but biopsy would be needed to tell which type and how treatable this would be.  Discussed with this that it is reassuring that PET scan did not show any additional areas that are concerning for metastases.

## 2019-07-21 NOTE — Assessment & Plan Note (Signed)
He has had variable success with quitting in the past.  Successful for a while with Chantix and would like to try again. Rx sent.

## 2019-07-22 ENCOUNTER — Encounter: Payer: Self-pay | Admitting: Family Medicine

## 2019-07-22 DIAGNOSIS — Z01818 Encounter for other preprocedural examination: Secondary | ICD-10-CM | POA: Diagnosis not present

## 2019-07-23 ENCOUNTER — Encounter: Payer: Self-pay | Admitting: Family Medicine

## 2019-07-26 DIAGNOSIS — C3412 Malignant neoplasm of upper lobe, left bronchus or lung: Secondary | ICD-10-CM | POA: Diagnosis not present

## 2019-07-26 DIAGNOSIS — E785 Hyperlipidemia, unspecified: Secondary | ICD-10-CM | POA: Diagnosis not present

## 2019-07-26 DIAGNOSIS — R918 Other nonspecific abnormal finding of lung field: Secondary | ICD-10-CM | POA: Diagnosis not present

## 2019-07-26 DIAGNOSIS — J449 Chronic obstructive pulmonary disease, unspecified: Secondary | ICD-10-CM | POA: Diagnosis not present

## 2019-07-26 DIAGNOSIS — G4733 Obstructive sleep apnea (adult) (pediatric): Secondary | ICD-10-CM | POA: Diagnosis not present

## 2019-07-26 DIAGNOSIS — Z48813 Encounter for surgical aftercare following surgery on the respiratory system: Secondary | ICD-10-CM | POA: Diagnosis not present

## 2019-07-26 DIAGNOSIS — Z79899 Other long term (current) drug therapy: Secondary | ICD-10-CM | POA: Diagnosis not present

## 2019-07-26 DIAGNOSIS — R0489 Hemorrhage from other sites in respiratory passages: Secondary | ICD-10-CM | POA: Diagnosis not present

## 2019-07-26 DIAGNOSIS — F17200 Nicotine dependence, unspecified, uncomplicated: Secondary | ICD-10-CM | POA: Diagnosis not present

## 2019-07-26 DIAGNOSIS — Z791 Long term (current) use of non-steroidal anti-inflammatories (NSAID): Secondary | ICD-10-CM | POA: Diagnosis not present

## 2019-07-26 DIAGNOSIS — I77811 Abdominal aortic ectasia: Secondary | ICD-10-CM | POA: Diagnosis not present

## 2019-07-26 DIAGNOSIS — R911 Solitary pulmonary nodule: Secondary | ICD-10-CM | POA: Diagnosis not present

## 2019-08-02 DIAGNOSIS — F17200 Nicotine dependence, unspecified, uncomplicated: Secondary | ICD-10-CM | POA: Diagnosis not present

## 2019-08-02 DIAGNOSIS — C3412 Malignant neoplasm of upper lobe, left bronchus or lung: Secondary | ICD-10-CM | POA: Diagnosis not present

## 2019-08-02 DIAGNOSIS — Z95828 Presence of other vascular implants and grafts: Secondary | ICD-10-CM | POA: Diagnosis not present

## 2019-08-02 DIAGNOSIS — Z79899 Other long term (current) drug therapy: Secondary | ICD-10-CM | POA: Diagnosis not present

## 2019-08-02 DIAGNOSIS — E785 Hyperlipidemia, unspecified: Secondary | ICD-10-CM | POA: Diagnosis not present

## 2019-08-02 DIAGNOSIS — J449 Chronic obstructive pulmonary disease, unspecified: Secondary | ICD-10-CM | POA: Diagnosis not present

## 2019-08-02 DIAGNOSIS — Z6830 Body mass index (BMI) 30.0-30.9, adult: Secondary | ICD-10-CM | POA: Diagnosis not present

## 2019-08-02 DIAGNOSIS — E669 Obesity, unspecified: Secondary | ICD-10-CM | POA: Diagnosis not present

## 2019-08-02 DIAGNOSIS — I10 Essential (primary) hypertension: Secondary | ICD-10-CM | POA: Diagnosis not present

## 2019-08-07 DIAGNOSIS — C3412 Malignant neoplasm of upper lobe, left bronchus or lung: Secondary | ICD-10-CM | POA: Diagnosis not present

## 2019-08-07 DIAGNOSIS — C3490 Malignant neoplasm of unspecified part of unspecified bronchus or lung: Secondary | ICD-10-CM | POA: Diagnosis not present

## 2019-08-09 DIAGNOSIS — F17200 Nicotine dependence, unspecified, uncomplicated: Secondary | ICD-10-CM | POA: Diagnosis not present

## 2019-08-09 DIAGNOSIS — I10 Essential (primary) hypertension: Secondary | ICD-10-CM | POA: Diagnosis not present

## 2019-08-09 DIAGNOSIS — E669 Obesity, unspecified: Secondary | ICD-10-CM | POA: Diagnosis not present

## 2019-08-09 DIAGNOSIS — J449 Chronic obstructive pulmonary disease, unspecified: Secondary | ICD-10-CM | POA: Diagnosis not present

## 2019-08-09 DIAGNOSIS — C3412 Malignant neoplasm of upper lobe, left bronchus or lung: Secondary | ICD-10-CM | POA: Diagnosis not present

## 2019-08-09 DIAGNOSIS — Z6830 Body mass index (BMI) 30.0-30.9, adult: Secondary | ICD-10-CM | POA: Diagnosis not present

## 2019-08-15 DIAGNOSIS — G4733 Obstructive sleep apnea (adult) (pediatric): Secondary | ICD-10-CM | POA: Diagnosis not present

## 2019-08-15 DIAGNOSIS — C3412 Malignant neoplasm of upper lobe, left bronchus or lung: Secondary | ICD-10-CM | POA: Diagnosis not present

## 2019-08-15 DIAGNOSIS — F17210 Nicotine dependence, cigarettes, uncomplicated: Secondary | ICD-10-CM | POA: Diagnosis not present

## 2019-08-15 DIAGNOSIS — J449 Chronic obstructive pulmonary disease, unspecified: Secondary | ICD-10-CM | POA: Diagnosis not present

## 2019-08-16 DIAGNOSIS — M25551 Pain in right hip: Secondary | ICD-10-CM | POA: Diagnosis not present

## 2019-08-16 DIAGNOSIS — M25552 Pain in left hip: Secondary | ICD-10-CM | POA: Diagnosis not present

## 2019-08-16 DIAGNOSIS — S76012A Strain of muscle, fascia and tendon of left hip, initial encounter: Secondary | ICD-10-CM | POA: Diagnosis not present

## 2019-08-31 DIAGNOSIS — Z20828 Contact with and (suspected) exposure to other viral communicable diseases: Secondary | ICD-10-CM | POA: Diagnosis not present

## 2019-08-31 DIAGNOSIS — Z01812 Encounter for preprocedural laboratory examination: Secondary | ICD-10-CM | POA: Diagnosis not present

## 2019-08-31 DIAGNOSIS — Z87891 Personal history of nicotine dependence: Secondary | ICD-10-CM | POA: Diagnosis not present

## 2019-08-31 DIAGNOSIS — I739 Peripheral vascular disease, unspecified: Secondary | ICD-10-CM | POA: Diagnosis not present

## 2019-08-31 DIAGNOSIS — Z01811 Encounter for preprocedural respiratory examination: Secondary | ICD-10-CM | POA: Diagnosis not present

## 2019-08-31 DIAGNOSIS — C3412 Malignant neoplasm of upper lobe, left bronchus or lung: Secondary | ICD-10-CM | POA: Diagnosis not present

## 2019-08-31 DIAGNOSIS — Z01818 Encounter for other preprocedural examination: Secondary | ICD-10-CM | POA: Diagnosis not present

## 2019-09-06 DIAGNOSIS — Z4682 Encounter for fitting and adjustment of non-vascular catheter: Secondary | ICD-10-CM | POA: Diagnosis not present

## 2019-09-06 DIAGNOSIS — Z8049 Family history of malignant neoplasm of other genital organs: Secondary | ICD-10-CM | POA: Diagnosis not present

## 2019-09-06 DIAGNOSIS — Z6829 Body mass index (BMI) 29.0-29.9, adult: Secondary | ICD-10-CM | POA: Diagnosis not present

## 2019-09-06 DIAGNOSIS — Z5331 Laparoscopic surgical procedure converted to open procedure: Secondary | ICD-10-CM | POA: Diagnosis not present

## 2019-09-06 DIAGNOSIS — C3412 Malignant neoplasm of upper lobe, left bronchus or lung: Secondary | ICD-10-CM | POA: Diagnosis present

## 2019-09-06 DIAGNOSIS — J439 Emphysema, unspecified: Secondary | ICD-10-CM | POA: Diagnosis present

## 2019-09-06 DIAGNOSIS — R918 Other nonspecific abnormal finding of lung field: Secondary | ICD-10-CM | POA: Diagnosis not present

## 2019-09-06 DIAGNOSIS — G4733 Obstructive sleep apnea (adult) (pediatric): Secondary | ICD-10-CM | POA: Diagnosis present

## 2019-09-06 DIAGNOSIS — Z96642 Presence of left artificial hip joint: Secondary | ICD-10-CM | POA: Diagnosis present

## 2019-09-06 DIAGNOSIS — E669 Obesity, unspecified: Secondary | ICD-10-CM | POA: Diagnosis present

## 2019-09-06 DIAGNOSIS — Z9049 Acquired absence of other specified parts of digestive tract: Secondary | ICD-10-CM | POA: Diagnosis not present

## 2019-09-06 DIAGNOSIS — I77811 Abdominal aortic ectasia: Secondary | ICD-10-CM | POA: Diagnosis present

## 2019-09-06 DIAGNOSIS — E785 Hyperlipidemia, unspecified: Secondary | ICD-10-CM | POA: Diagnosis present

## 2019-09-06 DIAGNOSIS — H9190 Unspecified hearing loss, unspecified ear: Secondary | ICD-10-CM | POA: Diagnosis present

## 2019-09-06 DIAGNOSIS — I10 Essential (primary) hypertension: Secondary | ICD-10-CM | POA: Diagnosis present

## 2019-09-06 DIAGNOSIS — G4700 Insomnia, unspecified: Secondary | ICD-10-CM | POA: Diagnosis present

## 2019-09-06 DIAGNOSIS — Z823 Family history of stroke: Secondary | ICD-10-CM | POA: Diagnosis not present

## 2019-09-06 DIAGNOSIS — Z20828 Contact with and (suspected) exposure to other viral communicable diseases: Secondary | ICD-10-CM | POA: Diagnosis present

## 2019-09-06 DIAGNOSIS — J9811 Atelectasis: Secondary | ICD-10-CM | POA: Diagnosis present

## 2019-09-06 DIAGNOSIS — D360 Benign neoplasm of lymph nodes: Secondary | ICD-10-CM | POA: Diagnosis not present

## 2019-09-06 DIAGNOSIS — F17210 Nicotine dependence, cigarettes, uncomplicated: Secondary | ICD-10-CM | POA: Diagnosis present

## 2019-09-13 DIAGNOSIS — J439 Emphysema, unspecified: Secondary | ICD-10-CM | POA: Diagnosis not present

## 2019-09-19 ENCOUNTER — Encounter: Payer: Self-pay | Admitting: Family Medicine

## 2019-09-19 ENCOUNTER — Telehealth (INDEPENDENT_AMBULATORY_CARE_PROVIDER_SITE_OTHER): Payer: Medicare Other | Admitting: Family Medicine

## 2019-09-19 ENCOUNTER — Other Ambulatory Visit: Payer: Self-pay

## 2019-09-19 VITALS — HR 64 | Ht 72.0 in

## 2019-09-19 DIAGNOSIS — I739 Peripheral vascular disease, unspecified: Secondary | ICD-10-CM | POA: Diagnosis not present

## 2019-09-19 NOTE — Assessment & Plan Note (Signed)
Increased claudication symptoms over the past couple of weeks.  No swelling, pain at rest.  I think DVT is less likely.  Discussed red flags with patient.   Recommend continued walking program as tolerated.  Referral entered to vascular surgery.

## 2019-09-19 NOTE — Progress Notes (Signed)
[name] - 68 y.o. male MRN [id number]  Date of birth: March 06, 1951   This visit type was conducted due to national recommendations for restrictions regarding the COVID-19 Pandemic (e.g. social distancing).  This format is felt to be most appropriate for this patient at this time.  All issues noted in this document were discussed and addressed.  No physical exam was performed (except for noted visual exam findings with Video Visits).  I discussed the limitations of evaluation and management by telemedicine and the availability of in person appointments. The patient expressed understanding and agreed to proceed.  I connected with@ on 09/19/19 at 11:00 AM EST by a video enabled telemedicine application and verified that I am speaking with the correct person using two identifiers.  Present at visit: Luetta Nutting, DO Cristy Folks   Patient Location: Home [address] [place] [address]   Provider location:   Home Office  Chief Complaint  Patient presents with  . Dysmenorrhea    pt is c/o of left leg cramping when walking, US done--not smoking but walk more now a days but leg cramp is worse. FYI--pt will have BP reading during ov.     HPI  [name] is a 68 y.o. male who presents via audio/video conferencing for a telehealth visit today.  He has complaint of increased claudication symptoms in his L leg.  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  He first noticed when trying to increase his walking around the hospital post operatively. He denies swelling of the leg, cold sensation, discoloration or pain at rest.  He had ABI in 03/2019 showing moderate arterial disease on the LLE.  He had been doing a walking program with some improvement up until recently.  He did recently quit smoking due to his lung cancer diagnosis and recent surgery.  He has no desire to restart smoking and has been prescribed Chantix through the [place].     ROS:  A comprehensive ROS  was completed and negative except as noted per HPI  Past Medical History:  Diagnosis Date  . COPD (chronic obstructive pulmonary disease) ([facility])   . Hypertension   . Lumbar stenosis   . Sleep apnea    Uses a cpap    Past Surgical History:  Procedure Laterality Date  . APPENDECTOMY    . Artery of leg     Bilateral repair  . JOINT REPLACEMENT    . LUMBAR LAMINECTOMY/DECOMPRESSION MICRODISCECTOMY N/A 02/17/2018   Procedure: Lumbar Two-Three, Lumbar Three-Four, Lumbar Four-Five Laminectomy and Foraminotomy;  Surgeon: Eustace Moore, MD;  Location: [facility];  Service: Neurosurgery;  Laterality: N/A;  . SHOULDER ARTHROSCOPY W/ ROTATOR CUFF REPAIR     Bilateral  . TOTAL HIP ARTHROPLASTY     bilateral    Family History  Problem Relation Age of Onset  . Ovarian cancer Mother     Social History   Socioeconomic History  . Marital status: Married    Spouse name: Not on file  . Number of children: Not on file  . Years of education: Not on file  . Highest education level: Not on file  Occupational History  . Not on file  Tobacco Use  . Smoking status: Current Every Day Smoker    Packs/day: 0.25    Types: Cigarettes  . Smokeless tobacco: Never Used  Substance and Sexual Activity  . Alcohol use: Yes    Comment: occasional  . Drug use: Never  . Sexual  activity: Not on file  Other Topics Concern  . Not on file  Social History Narrative  . Not on file   Social Determinants of Health   Financial Resource Strain:   . Difficulty of Paying Living Expenses: Not on file  Food Insecurity:   . Worried About Charity fundraiser in the Last Year: Not on file  . Ran Out of Food in the Last Year: Not on file  Transportation Needs:   . Lack of Transportation (Medical): Not on file  . Lack of Transportation (Non-Medical): Not on file  Physical Activity:   . Days of Exercise per Week: Not on file  . Minutes of Exercise per Session: Not on file  Stress:   . Feeling of Stress : Not on  file  Social Connections:   . Frequency of Communication with Friends and Family: Not on file  . Frequency of Social Gatherings with Friends and Family: Not on file  . Attends Religious Services: Not on file  . Active Member of Clubs or Organizations: Not on file  . Attends Archivist Meetings: Not on file  . Marital Status: Not on file  Intimate Partner Violence:   . Fear of Current or Ex-Partner: Not on file  . Emotionally Abused: Not on file  . Physically Abused: Not on file  . Sexually Abused: Not on file     Current Outpatient Medications:  .  acetaminophen (TYLENOL) 650 MG CR tablet, Take 1,300 mg by mouth daily., Disp: , Rfl:  .  albuterol (PROVENTIL HFA) 108 (90 Base) MCG/ACT inhaler, USE 2 INHALATIONS EVERY 6 HOURS AS NEEDED FOR WHEEZING, Disp: , Rfl:  .  ALPRAZolam (XANAX) 0.5 MG tablet, Take 0.5 mg by mouth at bedtime as needed for sleep., Disp: , Rfl: 1 .  aspirin 81 MG chewable tablet, Chew by mouth., Disp: , Rfl:  .  atorvastatin (LIPITOR) 10 MG tablet, Take 10 mg by mouth at bedtime., Disp: , Rfl:  .  azelastine (ASTELIN) 0.1 % nasal spray, Place 2 sprays into both nostrils 2 (two) times daily. Use in each nostril as directed, Disp: 30 mL, Rfl: 12 .  Cholecalciferol (VITAMIN D) 2000 units tablet, Take 2,000 Units by mouth daily., Disp: , Rfl:  .  gabapentin (NEURONTIN) 300 MG capsule, Take 300 mg by mouth 3 (three) times daily., Disp: , Rfl:  .  glucosamine-chondroitin 500-400 MG tablet, Take by mouth., Disp: , Rfl:  .  hydrochlorothiazide (HYDRODIURIL) 25 MG tablet, Take 25 mg by mouth daily., Disp: , Rfl:  .  losartan (COZAAR) 50 MG tablet, Take 50 mg by mouth daily., Disp: , Rfl:  .  Multiple Vitamin (MULTIVITAMIN WITH MINERALS) TABS tablet, Take 1 tablet by mouth daily., Disp: , Rfl:  .  Omega-3 Fatty Acids (FISH OIL PO), Take 1 capsule by mouth daily., Disp: , Rfl:  .  Potassium 99 MG TABS, Take 99 mg by mouth daily., Disp: , Rfl:  .   promethazine-dextromethorphan (PROMETHAZINE-DM) 6.25-15 MG/5ML syrup, Take 5 mLs by mouth 4 (four) times daily as needed for cough., Disp: 200 mL, Rfl: 1 .  SPIRIVA RESPIMAT 2.5 MCG/ACT AERS, , Disp: , Rfl:  .  tiotropium (SPIRIVA HANDIHALER) 18 MCG inhalation capsule, Place into inhaler and inhale., Disp: , Rfl:  .  Multiple Vitamin (MULTIVITAMIN) capsule, Take by mouth., Disp: , Rfl:  .  Naproxen Sodium (ALEVE) 220 MG CAPS, Take by mouth., Disp: , Rfl:  .  oxyCODONE (OXY IR/ROXICODONE) 5 MG immediate release  tablet, TK 1 TO 2 TS PO Q 4 H PRN P FOR UP TO 7 DAYS, Disp: , Rfl:  .  PICATO 0.015 % GEL, , Disp: , Rfl:  .  potassium gluconate (RA POTASSIUM GLUCONATE) 595 (99 K) MG TABS tablet, Take by mouth., Disp: , Rfl:  .  varenicline (CHANTIX STARTING MONTH PAK) 0.5 MG X 11 & 1 MG X 42 tablet, Take one 0.5 mg tablet by mouth once daily for 3 days, then increase to one 0.5 mg tablet twice daily for 4 days, then increase to one 1 mg tablet twice daily. (Patient not taking: Reported on 09/19/2019), Disp: 53 tablet, Rfl: 0 .  Vitamins/Minerals TABS, Take by mouth., Disp: , Rfl:   EXAM:  VITALS per patient if applicable: Pulse 64 Comment: pt reprt  Ht 6' (1.829 m)   SpO2 95% Comment: pt reprt  BMI 30.62 kg/m   GENERAL: alert, oriented, appears well and in no acute distress  HEENT: atraumatic, conjunttiva clear, no obvious abnormalities on inspection of external nose and ears  NECK: normal movements of the head and neck  LUNGS: on inspection no signs of respiratory distress, breathing rate appears normal, no obvious gross SOB, gasping or wheezing  CV: no obvious cyanosis  MS: moves all visible extremities without noticeable abnormality  PSYCH/NEURO: pleasant and cooperative, no obvious depression or anxiety, speech and thought processing grossly intact  ASSESSMENT AND PLAN:  Discussed the following assessment and plan:  PVD (peripheral vascular disease) ([facility]) Increased claudication  symptoms over the past couple of weeks.  No swelling, pain at rest.  I think DVT is less likely.  Discussed red flags with patient.   Recommend continued walking program as tolerated.  Referral entered to vascular surgery.        I discussed the assessment and treatment plan with the patient. The patient was provided an opportunity to ask questions and all were answered. The patient agreed with the plan and demonstrated an understanding of the instructions.   The patient was advised to call back or seek an in-person evaluation if the symptoms worsen or if the condition fails to improve as anticipated.    Luetta Nutting, DO

## 2019-09-20 ENCOUNTER — Telehealth: Payer: Self-pay

## 2019-09-20 NOTE — Telephone Encounter (Signed)
Copied from [place] [phone]. Topic: General - Other >> Sep 20, 2019 10:34 AM Leward Quan A wrote: Reason for CRM: Patient called to inform Dr Zigmund Daniel that his left foot is numb been that way since he woke up this morning foot still has color so don't know if it has to do with circulation. Asking for a call back please at Ph#  [phone]

## 2019-09-20 NOTE — Telephone Encounter (Signed)
Please ask if he is having any increased pain, swelling or if leg/foot feels cold at all.

## 2019-09-20 NOTE — Telephone Encounter (Signed)
I called pt and he states he has since spoke with Dr. Randell Patient, who did his recent surgery and he was told to go to the hospital due to concerns of a possible blood clot. Pt states he is currently on his way to [facility]

## 2019-09-28 ENCOUNTER — Encounter: Payer: Self-pay | Admitting: Family Medicine

## 2019-10-02 DIAGNOSIS — Z09 Encounter for follow-up examination after completed treatment for conditions other than malignant neoplasm: Secondary | ICD-10-CM | POA: Diagnosis not present

## 2019-10-04 ENCOUNTER — Other Ambulatory Visit: Payer: Self-pay | Admitting: Family Medicine

## 2019-10-04 MED ORDER — PROMETHAZINE-DM 6.25-15 MG/5ML PO SYRP: 5.0000 mL | ORAL_SOLUTION | Freq: Four times a day (QID) | ORAL | 1 refills | Status: DC | PRN

## 2019-10-04 NOTE — Telephone Encounter (Signed)
Patient called to find out the status of his refill for cough syrup. Patient stated that he has taken the last of the cough syrup today. Please contact patient to further assist with the status.

## 2019-10-05 DIAGNOSIS — Z09 Encounter for follow-up examination after completed treatment for conditions other than malignant neoplasm: Secondary | ICD-10-CM | POA: Diagnosis not present

## 2019-10-17 DIAGNOSIS — I739 Peripheral vascular disease, unspecified: Secondary | ICD-10-CM | POA: Diagnosis not present

## 2019-10-23 DIAGNOSIS — J90 Pleural effusion, not elsewhere classified: Secondary | ICD-10-CM | POA: Diagnosis not present

## 2019-10-23 DIAGNOSIS — J948 Other specified pleural conditions: Secondary | ICD-10-CM | POA: Diagnosis not present

## 2019-10-23 DIAGNOSIS — R0602 Shortness of breath: Secondary | ICD-10-CM | POA: Diagnosis not present

## 2019-10-24 DIAGNOSIS — Z48812 Encounter for surgical aftercare following surgery on the circulatory system: Secondary | ICD-10-CM | POA: Diagnosis not present

## 2019-10-24 DIAGNOSIS — I739 Peripheral vascular disease, unspecified: Secondary | ICD-10-CM | POA: Diagnosis not present

## 2019-11-01 DIAGNOSIS — L579 Skin changes due to chronic exposure to nonionizing radiation, unspecified: Secondary | ICD-10-CM | POA: Diagnosis not present

## 2019-11-01 DIAGNOSIS — Z85828 Personal history of other malignant neoplasm of skin: Secondary | ICD-10-CM | POA: Diagnosis not present

## 2019-11-01 DIAGNOSIS — L821 Other seborrheic keratosis: Secondary | ICD-10-CM | POA: Diagnosis not present

## 2019-11-01 DIAGNOSIS — L814 Other melanin hyperpigmentation: Secondary | ICD-10-CM | POA: Diagnosis not present

## 2019-11-01 DIAGNOSIS — D225 Melanocytic nevi of trunk: Secondary | ICD-10-CM | POA: Diagnosis not present

## 2019-11-01 DIAGNOSIS — D485 Neoplasm of uncertain behavior of skin: Secondary | ICD-10-CM | POA: Diagnosis not present

## 2019-11-21 DIAGNOSIS — H2513 Age-related nuclear cataract, bilateral: Secondary | ICD-10-CM | POA: Diagnosis not present

## 2019-12-04 DIAGNOSIS — M7742 Metatarsalgia, left foot: Secondary | ICD-10-CM | POA: Diagnosis not present

## 2019-12-05 DIAGNOSIS — F17200 Nicotine dependence, unspecified, uncomplicated: Secondary | ICD-10-CM | POA: Diagnosis not present

## 2019-12-05 DIAGNOSIS — C3412 Malignant neoplasm of upper lobe, left bronchus or lung: Secondary | ICD-10-CM | POA: Diagnosis not present

## 2019-12-05 DIAGNOSIS — Z87891 Personal history of nicotine dependence: Secondary | ICD-10-CM | POA: Diagnosis not present

## 2019-12-05 DIAGNOSIS — Z79899 Other long term (current) drug therapy: Secondary | ICD-10-CM | POA: Diagnosis not present

## 2020-01-01 DIAGNOSIS — Z125 Encounter for screening for malignant neoplasm of prostate: Secondary | ICD-10-CM | POA: Diagnosis not present

## 2020-01-01 DIAGNOSIS — J449 Chronic obstructive pulmonary disease, unspecified: Secondary | ICD-10-CM | POA: Diagnosis not present

## 2020-01-01 DIAGNOSIS — R7301 Impaired fasting glucose: Secondary | ICD-10-CM | POA: Diagnosis not present

## 2020-01-01 DIAGNOSIS — F17200 Nicotine dependence, unspecified, uncomplicated: Secondary | ICD-10-CM | POA: Diagnosis not present

## 2020-01-01 DIAGNOSIS — I10 Essential (primary) hypertension: Secondary | ICD-10-CM | POA: Diagnosis not present

## 2020-01-01 DIAGNOSIS — Z85118 Personal history of other malignant neoplasm of bronchus and lung: Secondary | ICD-10-CM | POA: Diagnosis not present

## 2020-01-01 DIAGNOSIS — F5101 Primary insomnia: Secondary | ICD-10-CM | POA: Diagnosis not present

## 2020-01-01 DIAGNOSIS — E782 Mixed hyperlipidemia: Secondary | ICD-10-CM | POA: Diagnosis not present

## 2020-01-17 DIAGNOSIS — Z902 Acquired absence of lung [part of]: Secondary | ICD-10-CM | POA: Diagnosis not present

## 2020-01-17 DIAGNOSIS — C3412 Malignant neoplasm of upper lobe, left bronchus or lung: Secondary | ICD-10-CM | POA: Diagnosis not present

## 2020-01-17 DIAGNOSIS — G4733 Obstructive sleep apnea (adult) (pediatric): Secondary | ICD-10-CM | POA: Diagnosis not present

## 2020-01-17 DIAGNOSIS — J449 Chronic obstructive pulmonary disease, unspecified: Secondary | ICD-10-CM | POA: Diagnosis not present

## 2020-02-05 DIAGNOSIS — M6281 Muscle weakness (generalized): Secondary | ICD-10-CM | POA: Diagnosis not present

## 2020-02-05 DIAGNOSIS — M25551 Pain in right hip: Secondary | ICD-10-CM | POA: Diagnosis not present

## 2020-02-05 DIAGNOSIS — M25552 Pain in left hip: Secondary | ICD-10-CM | POA: Diagnosis not present

## 2020-02-07 DIAGNOSIS — M25551 Pain in right hip: Secondary | ICD-10-CM | POA: Diagnosis not present

## 2020-02-07 DIAGNOSIS — M6281 Muscle weakness (generalized): Secondary | ICD-10-CM | POA: Diagnosis not present

## 2020-02-07 DIAGNOSIS — M25552 Pain in left hip: Secondary | ICD-10-CM | POA: Diagnosis not present

## 2020-02-12 DIAGNOSIS — Z902 Acquired absence of lung [part of]: Secondary | ICD-10-CM | POA: Diagnosis not present

## 2020-02-12 DIAGNOSIS — M6281 Muscle weakness (generalized): Secondary | ICD-10-CM | POA: Diagnosis not present

## 2020-02-12 DIAGNOSIS — C3412 Malignant neoplasm of upper lobe, left bronchus or lung: Secondary | ICD-10-CM | POA: Diagnosis not present

## 2020-02-12 DIAGNOSIS — G4733 Obstructive sleep apnea (adult) (pediatric): Secondary | ICD-10-CM | POA: Diagnosis not present

## 2020-02-12 DIAGNOSIS — J449 Chronic obstructive pulmonary disease, unspecified: Secondary | ICD-10-CM | POA: Diagnosis not present

## 2020-02-12 DIAGNOSIS — M25551 Pain in right hip: Secondary | ICD-10-CM | POA: Diagnosis not present

## 2020-02-12 DIAGNOSIS — M25552 Pain in left hip: Secondary | ICD-10-CM | POA: Diagnosis not present

## 2020-02-13 DIAGNOSIS — Z23 Encounter for immunization: Secondary | ICD-10-CM | POA: Diagnosis not present

## 2020-02-14 DIAGNOSIS — M25552 Pain in left hip: Secondary | ICD-10-CM | POA: Diagnosis not present

## 2020-02-14 DIAGNOSIS — M6281 Muscle weakness (generalized): Secondary | ICD-10-CM | POA: Diagnosis not present

## 2020-02-14 DIAGNOSIS — M25551 Pain in right hip: Secondary | ICD-10-CM | POA: Diagnosis not present

## 2020-02-19 DIAGNOSIS — M25552 Pain in left hip: Secondary | ICD-10-CM | POA: Diagnosis not present

## 2020-02-19 DIAGNOSIS — M25551 Pain in right hip: Secondary | ICD-10-CM | POA: Diagnosis not present

## 2020-02-19 DIAGNOSIS — M6281 Muscle weakness (generalized): Secondary | ICD-10-CM | POA: Diagnosis not present

## 2020-02-21 DIAGNOSIS — M25552 Pain in left hip: Secondary | ICD-10-CM | POA: Diagnosis not present

## 2020-02-21 DIAGNOSIS — M25551 Pain in right hip: Secondary | ICD-10-CM | POA: Diagnosis not present

## 2020-02-21 DIAGNOSIS — M6281 Muscle weakness (generalized): Secondary | ICD-10-CM | POA: Diagnosis not present

## 2020-02-23 DIAGNOSIS — I739 Peripheral vascular disease, unspecified: Secondary | ICD-10-CM | POA: Diagnosis not present

## 2020-02-23 DIAGNOSIS — Z48812 Encounter for surgical aftercare following surgery on the circulatory system: Secondary | ICD-10-CM | POA: Diagnosis not present

## 2020-02-29 DIAGNOSIS — J9811 Atelectasis: Secondary | ICD-10-CM | POA: Diagnosis not present

## 2020-02-29 DIAGNOSIS — J439 Emphysema, unspecified: Secondary | ICD-10-CM | POA: Diagnosis not present

## 2020-02-29 DIAGNOSIS — R918 Other nonspecific abnormal finding of lung field: Secondary | ICD-10-CM | POA: Diagnosis not present

## 2020-02-29 DIAGNOSIS — C3490 Malignant neoplasm of unspecified part of unspecified bronchus or lung: Secondary | ICD-10-CM | POA: Diagnosis not present

## 2020-02-29 DIAGNOSIS — C3412 Malignant neoplasm of upper lobe, left bronchus or lung: Secondary | ICD-10-CM | POA: Diagnosis not present

## 2020-03-05 DIAGNOSIS — Z23 Encounter for immunization: Secondary | ICD-10-CM | POA: Diagnosis not present

## 2020-03-07 DIAGNOSIS — F17210 Nicotine dependence, cigarettes, uncomplicated: Secondary | ICD-10-CM | POA: Diagnosis not present

## 2020-03-07 DIAGNOSIS — D649 Anemia, unspecified: Secondary | ICD-10-CM | POA: Diagnosis not present

## 2020-03-07 DIAGNOSIS — C3412 Malignant neoplasm of upper lobe, left bronchus or lung: Secondary | ICD-10-CM | POA: Diagnosis not present

## 2020-03-07 DIAGNOSIS — J449 Chronic obstructive pulmonary disease, unspecified: Secondary | ICD-10-CM | POA: Diagnosis not present

## 2020-05-01 DIAGNOSIS — L570 Actinic keratosis: Secondary | ICD-10-CM | POA: Diagnosis not present

## 2020-05-01 DIAGNOSIS — D225 Melanocytic nevi of trunk: Secondary | ICD-10-CM | POA: Diagnosis not present

## 2020-05-01 DIAGNOSIS — Z85828 Personal history of other malignant neoplasm of skin: Secondary | ICD-10-CM | POA: Diagnosis not present

## 2020-05-01 DIAGNOSIS — L579 Skin changes due to chronic exposure to nonionizing radiation, unspecified: Secondary | ICD-10-CM | POA: Diagnosis not present

## 2020-05-01 DIAGNOSIS — L821 Other seborrheic keratosis: Secondary | ICD-10-CM | POA: Diagnosis not present

## 2020-05-01 DIAGNOSIS — L814 Other melanin hyperpigmentation: Secondary | ICD-10-CM | POA: Diagnosis not present

## 2020-05-08 DIAGNOSIS — G4733 Obstructive sleep apnea (adult) (pediatric): Secondary | ICD-10-CM | POA: Diagnosis not present

## 2020-05-08 DIAGNOSIS — J449 Chronic obstructive pulmonary disease, unspecified: Secondary | ICD-10-CM | POA: Diagnosis not present

## 2020-06-04 DIAGNOSIS — I313 Pericardial effusion (noninflammatory): Secondary | ICD-10-CM | POA: Diagnosis not present

## 2020-06-04 DIAGNOSIS — C3412 Malignant neoplasm of upper lobe, left bronchus or lung: Secondary | ICD-10-CM | POA: Diagnosis not present

## 2020-06-04 DIAGNOSIS — C3490 Malignant neoplasm of unspecified part of unspecified bronchus or lung: Secondary | ICD-10-CM | POA: Diagnosis not present

## 2020-06-04 DIAGNOSIS — R911 Solitary pulmonary nodule: Secondary | ICD-10-CM | POA: Diagnosis not present

## 2020-06-07 DIAGNOSIS — J449 Chronic obstructive pulmonary disease, unspecified: Secondary | ICD-10-CM | POA: Diagnosis not present

## 2020-06-07 DIAGNOSIS — F17210 Nicotine dependence, cigarettes, uncomplicated: Secondary | ICD-10-CM | POA: Diagnosis not present

## 2020-06-07 DIAGNOSIS — D508 Other iron deficiency anemias: Secondary | ICD-10-CM | POA: Diagnosis not present

## 2020-06-07 DIAGNOSIS — N281 Cyst of kidney, acquired: Secondary | ICD-10-CM | POA: Diagnosis not present

## 2020-06-07 DIAGNOSIS — C3412 Malignant neoplasm of upper lobe, left bronchus or lung: Secondary | ICD-10-CM | POA: Diagnosis not present

## 2020-06-07 DIAGNOSIS — Z902 Acquired absence of lung [part of]: Secondary | ICD-10-CM | POA: Diagnosis not present

## 2020-06-27 DIAGNOSIS — E782 Mixed hyperlipidemia: Secondary | ICD-10-CM | POA: Diagnosis not present

## 2020-06-27 DIAGNOSIS — Z125 Encounter for screening for malignant neoplasm of prostate: Secondary | ICD-10-CM | POA: Diagnosis not present

## 2020-07-02 DIAGNOSIS — E782 Mixed hyperlipidemia: Secondary | ICD-10-CM | POA: Diagnosis not present

## 2020-07-02 DIAGNOSIS — Z0000 Encounter for general adult medical examination without abnormal findings: Secondary | ICD-10-CM | POA: Diagnosis not present

## 2020-07-02 DIAGNOSIS — I10 Essential (primary) hypertension: Secondary | ICD-10-CM | POA: Diagnosis not present

## 2020-07-02 DIAGNOSIS — R7301 Impaired fasting glucose: Secondary | ICD-10-CM | POA: Diagnosis not present

## 2020-07-02 DIAGNOSIS — F17200 Nicotine dependence, unspecified, uncomplicated: Secondary | ICD-10-CM | POA: Diagnosis not present

## 2020-07-02 DIAGNOSIS — F419 Anxiety disorder, unspecified: Secondary | ICD-10-CM | POA: Diagnosis not present

## 2020-07-02 DIAGNOSIS — J449 Chronic obstructive pulmonary disease, unspecified: Secondary | ICD-10-CM | POA: Diagnosis not present

## 2020-07-02 DIAGNOSIS — G4733 Obstructive sleep apnea (adult) (pediatric): Secondary | ICD-10-CM | POA: Diagnosis not present

## 2020-07-02 DIAGNOSIS — I739 Peripheral vascular disease, unspecified: Secondary | ICD-10-CM | POA: Diagnosis not present

## 2020-07-17 DIAGNOSIS — I10 Essential (primary) hypertension: Secondary | ICD-10-CM | POA: Diagnosis not present

## 2020-07-17 DIAGNOSIS — H6503 Acute serous otitis media, bilateral: Secondary | ICD-10-CM | POA: Diagnosis not present

## 2020-07-31 DIAGNOSIS — I10 Essential (primary) hypertension: Secondary | ICD-10-CM | POA: Diagnosis not present

## 2020-08-08 DIAGNOSIS — G4733 Obstructive sleep apnea (adult) (pediatric): Secondary | ICD-10-CM | POA: Diagnosis not present

## 2020-08-09 DIAGNOSIS — J449 Chronic obstructive pulmonary disease, unspecified: Secondary | ICD-10-CM | POA: Diagnosis not present

## 2020-08-09 DIAGNOSIS — J40 Bronchitis, not specified as acute or chronic: Secondary | ICD-10-CM | POA: Diagnosis not present

## 2020-08-28 DIAGNOSIS — Z48812 Encounter for surgical aftercare following surgery on the circulatory system: Secondary | ICD-10-CM | POA: Diagnosis not present

## 2020-08-28 DIAGNOSIS — I739 Peripheral vascular disease, unspecified: Secondary | ICD-10-CM | POA: Diagnosis not present

## 2020-09-02 DIAGNOSIS — M792 Neuralgia and neuritis, unspecified: Secondary | ICD-10-CM | POA: Diagnosis not present

## 2020-09-02 DIAGNOSIS — M7742 Metatarsalgia, left foot: Secondary | ICD-10-CM | POA: Diagnosis not present

## 2020-09-03 DIAGNOSIS — N281 Cyst of kidney, acquired: Secondary | ICD-10-CM | POA: Diagnosis not present

## 2020-09-03 DIAGNOSIS — I2510 Atherosclerotic heart disease of native coronary artery without angina pectoris: Secondary | ICD-10-CM | POA: Diagnosis not present

## 2020-09-03 DIAGNOSIS — C3492 Malignant neoplasm of unspecified part of left bronchus or lung: Secondary | ICD-10-CM | POA: Diagnosis not present

## 2020-09-03 DIAGNOSIS — J439 Emphysema, unspecified: Secondary | ICD-10-CM | POA: Diagnosis not present

## 2020-09-03 DIAGNOSIS — C3412 Malignant neoplasm of upper lobe, left bronchus or lung: Secondary | ICD-10-CM | POA: Diagnosis not present

## 2020-09-06 DIAGNOSIS — Z902 Acquired absence of lung [part of]: Secondary | ICD-10-CM | POA: Diagnosis not present

## 2020-09-06 DIAGNOSIS — J449 Chronic obstructive pulmonary disease, unspecified: Secondary | ICD-10-CM | POA: Diagnosis not present

## 2020-09-06 DIAGNOSIS — Z85118 Personal history of other malignant neoplasm of bronchus and lung: Secondary | ICD-10-CM | POA: Diagnosis not present

## 2020-09-06 DIAGNOSIS — C3412 Malignant neoplasm of upper lobe, left bronchus or lung: Secondary | ICD-10-CM | POA: Diagnosis not present

## 2020-09-06 DIAGNOSIS — Z08 Encounter for follow-up examination after completed treatment for malignant neoplasm: Secondary | ICD-10-CM | POA: Diagnosis not present

## 2020-09-06 DIAGNOSIS — D508 Other iron deficiency anemias: Secondary | ICD-10-CM | POA: Diagnosis not present

## 2020-09-06 DIAGNOSIS — N281 Cyst of kidney, acquired: Secondary | ICD-10-CM | POA: Diagnosis not present

## 2020-09-17 DIAGNOSIS — I739 Peripheral vascular disease, unspecified: Secondary | ICD-10-CM | POA: Diagnosis not present

## 2020-12-09 ENCOUNTER — Ambulatory Visit (INDEPENDENT_AMBULATORY_CARE_PROVIDER_SITE_OTHER): Payer: Medicare Other | Admitting: Neurology

## 2020-12-09 ENCOUNTER — Encounter: Payer: Self-pay | Admitting: Neurology

## 2020-12-09 VITALS — BP 150/88 | HR 76 | Ht 71.0 in | Wt 219.0 lb

## 2020-12-09 DIAGNOSIS — F17200 Nicotine dependence, unspecified, uncomplicated: Secondary | ICD-10-CM

## 2020-12-09 DIAGNOSIS — M5136 Other intervertebral disc degeneration, lumbar region: Secondary | ICD-10-CM

## 2020-12-09 DIAGNOSIS — R29898 Other symptoms and signs involving the musculoskeletal system: Secondary | ICD-10-CM

## 2020-12-09 DIAGNOSIS — N281 Cyst of kidney, acquired: Secondary | ICD-10-CM

## 2020-12-09 DIAGNOSIS — G629 Polyneuropathy, unspecified: Secondary | ICD-10-CM

## 2020-12-09 DIAGNOSIS — G608 Other hereditary and idiopathic neuropathies: Secondary | ICD-10-CM

## 2020-12-09 DIAGNOSIS — G5793 Unspecified mononeuropathy of bilateral lower limbs: Secondary | ICD-10-CM

## 2020-12-09 DIAGNOSIS — C3492 Malignant neoplasm of unspecified part of left bronchus or lung: Secondary | ICD-10-CM

## 2020-12-09 DIAGNOSIS — Z789 Other specified health status: Secondary | ICD-10-CM

## 2020-12-09 DIAGNOSIS — D649 Anemia, unspecified: Secondary | ICD-10-CM

## 2020-12-09 NOTE — Progress Notes (Signed)
Subjective:    Patient ID: [name] is a 70 y.o. male.  HPI     Star Age, MD, PhD [facility] [address] [place], [place] [address]  Dear Dr. Ronnald Ramp,   I saw your patient, [name], upon your kind request, in my Neurologic clinic today for initial consultation of his neuropathy.  The patient is accompanied by his wife today.  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  He denies any painful stinging sensation or burning sensation but has noticed some weakness in his arms.  His symptoms are primarily in the upper extremities and or not necessarily progressive.  He reports that he practically woke up with the symptoms.  He had a prior brain MRI when he was diagnosed with lung cancer in 2020 and I was able to review the results.  He had a brain MRI with and without contrast on 08/08/2019: IMPRESSION:   No evidence of metastatic disease or other significant abnormality.  More recently, he had an MRI of the cervical spine without contrast through [facility] on 10/31/2020 and I reviewed the results:  1. No high-grade spinal canal stenosis in the cervical spine or cord signal abnormality.  2. Facet hypertrophy and uncovertebral joint spurring result in varying degrees of foraminal stenosis that is generally moderate in severity, but most advanced on the left at C3-C4 and C6-C7, as detailed above.  3. Patchy edema associated with the left C2-C3 facet and right  inferior articular process of C4 is likely degenerative.  He is retired.  He does ride his motorbike, often long distance.  That is the only repetitive position and movement he has.  He uses a trike. He is not aware of any family history of neuropathy. He reports that he has a longstanding history of back pain and sciatica and bilateral foot drop.  He has AFOs through the [place].  He smokes, he smokes approximately half a pack to one third of a pack per day, he is trying to quit.  He did not have any chemotherapy or radiation therapy for his lung cancer.  He does drink alcohol, typically 2 drinks daily, bourbon, no beer, or wine.  He does not always hydrate well with water by itself admission.  I reviewed your office note from 12/15/2020.  He had a EMG and nerve conduction velocity test through your office with Dr. 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  Appears to be demyelinating and axonal loss in nature.  Findings rather symmetric.  The findings were super fine, it makes it challenging to discern whether there is any superimposed.  Ulnar neuropathy although I think that is unlikely, particularly in light of his history given above.  Again he tells me he was entirely asymptomatic until 10/04/2020  when he noted the acute onset of symptoms.  He tells me he actually rode his motorcycle around the country 12,000 miles last year and had no symptoms whatsoever.  Again I think it makes it unlikely that there is superimposed moderate neuropathy.  With the acute onset of symptoms about 6 weeks ago the electrodiagnostic picture may be evolving.   Certainly could consider limited repeat study should neurologic deficits evolve.  Recommend referral to neurology for further work-up as far as etiology.  >>  His Past Medical History Is Significant For: Past Medical History:  Diagnosis Date  . COPD (chronic obstructive pulmonary disease) ([place])   . Hypertension   . Lumbar stenosis   . Sleep apnea    Uses a cpap    His Past Surgical History Is Significant For: Past Surgical History:  Procedure Laterality Date  . APPENDECTOMY    . Artery of leg     Bilateral repair  . JOINT REPLACEMENT    . LUMBAR LAMINECTOMY/DECOMPRESSION MICRODISCECTOMY N/A 02/17/2018   Procedure: Lumbar Two-Three, Lumbar Three-Four, Lumbar Four-Five Laminectomy and Foraminotomy;  Surgeon: Eustace Moore, MD;  Location: [place];  Service: Neurosurgery;  Laterality: N/A;  . SHOULDER ARTHROSCOPY W/ ROTATOR CUFF REPAIR     Bilateral  . TOTAL HIP ARTHROPLASTY     bilateral    His Family History Is Significant For: Family History  Problem Relation Age of Onset  . Ovarian cancer Mother     His Social History Is Significant For: Social History   Socioeconomic History  . Marital status: Married    Spouse name: Not on file  . Number of children: Not on file  . Years of education: Not on file  . Highest education level: Not on file  Occupational History  . Not on file  Tobacco Use  . Smoking status: Current Every Day Smoker    Packs/day: 0.25    Types: Cigarettes  . Smokeless tobacco: Never Used  Vaping Use  . Vaping Use: Never used  Substance and Sexual Activity  . Alcohol use: Yes    Comment: occasional  . Drug use: Never  . Sexual activity: Not on file  Other Topics Concern  . Not on file  Social History Narrative  . Not on file   Social Determinants of Health   Financial Resource Strain: Not on file  Food Insecurity: Not on file  Transportation Needs: Not on file  Physical Activity: Not on file  Stress: Not on file  Social Connections: Not  on file    His Allergies Are:  Allergies  Allergen Reactions  . Bupropion Other (See Comments)    Contraindicated - seizure in childhood (he has never taken medication, but we discussed it in [facility] for smoking cessation) Contraindicated - seizure in childhood (he has never taken medication, but we discussed it in [place] for smoking cessation) Contraindicated - seizure in childhood (he has never taken medication, but we discussed it in [facility] for smoking cessation)   . Codeine Itching  . Morphine And Related Itching  :   His Current Medications Are:  Outpatient Encounter Medications as of 12/09/2020  Medication Sig  . albuterol (VENTOLIN HFA) 108 (90 Base) MCG/ACT inhaler USE 2 INHALATIONS EVERY 6 HOURS AS NEEDED FOR WHEEZING  . ALPRAZolam (XANAX) 0.5 MG tablet Take 0.5 mg by mouth at bedtime as needed for sleep.  Marland Kitchen aspirin 81 MG chewable tablet Chew by mouth.  Marland Kitchen atorvastatin (LIPITOR) 10 MG tablet Take 10 mg by mouth at  bedtime.  Marland Kitchen azelastine (ASTELIN) 0.1 % nasal spray Place 2 sprays into both nostrils 2 (two) times daily. Use in each nostril as directed  . Cholecalciferol (VITAMIN D) 2000 units tablet Take 2,000 Units by mouth daily.  Marland Kitchen gabapentin (NEURONTIN) 300 MG capsule Take 300 mg by mouth 3 (three) times daily.  Marland Kitchen glucosamine-chondroitin 500-400 MG tablet Take by mouth.  . hydrochlorothiazide (HYDRODIURIL) 25 MG tablet Take 25 mg by mouth daily.  Marland Kitchen losartan (COZAAR) 50 MG tablet Take 50 mg by mouth daily.  . Multiple Vitamin (MULTIVITAMIN WITH MINERALS) TABS tablet Take 1 tablet by mouth daily.  . Multiple Vitamin (MULTIVITAMIN) capsule Take by mouth.  . Omega-3 Fatty Acids (FISH OIL PO) Take 1 capsule by mouth daily.  . Potassium 99 MG TABS Take 99 mg by mouth daily.  . potassium gluconate 595 (99 K) MG TABS tablet Take by mouth.  . Vitamins/Minerals TABS Take by mouth.  . [DISCONTINUED] acetaminophen (TYLENOL) 650 MG CR tablet Take 1,300 mg by mouth daily.  . [DISCONTINUED] Naproxen  Sodium (ALEVE) 220 MG CAPS Take by mouth.  . [DISCONTINUED] oxyCODONE (OXY IR/ROXICODONE) 5 MG immediate release tablet TK 1 TO 2 TS PO Q 4 H PRN P FOR UP TO 7 DAYS  . [DISCONTINUED] PICATO 0.015 % GEL   . [DISCONTINUED] promethazine-dextromethorphan (PROMETHAZINE-DM) 6.25-15 MG/5ML syrup Take 5 mLs by mouth 4 (four) times daily as needed for cough.  . [DISCONTINUED] SPIRIVA RESPIMAT 2.5 MCG/ACT AERS   . [DISCONTINUED] tiotropium (SPIRIVA HANDIHALER) 18 MCG inhalation capsule Place into inhaler and inhale.  . [DISCONTINUED] varenicline (CHANTIX STARTING MONTH PAK) 0.5 MG X 11 & 1 MG X 42 tablet Take one 0.5 mg tablet by mouth once daily for 3 days, then increase to one 0.5 mg tablet twice daily for 4 days, then increase to one 1 mg tablet twice daily. (Patient not taking: Reported on 09/19/2019)   No facility-administered encounter medications on file as of 12/09/2020.  :   Review of Systems:  Out of a complete 14 point review of systems, all are reviewed and negative with the exception of these symptoms as listed below: Review of Systems  Neurological:       Here to discuss worsening neuropathy. Reports sx are primarily in bilateral hands. Pt reports sx started in Jan of this year.    Objective:  Neurological Exam  Physical Exam Physical Examination:   There were no vitals filed for this visit.  General Examination: The patient is a very pleasant 70 y.o. male in no acute distress. He appears well-developed and well-nourished and well groomed.   HEENT: Normocephalic, atraumatic, pupils are equal, round and reactive to light, extraocular tracking is well-preserved, hearing is grossly intact, he has hearing aids.  Face is symmetric with normal facial animation and normal facial sensation to light touch, vibration and temperature.  He has no lip, neck or jaw tremor.  Airway examination reveals moderate airway crowding, tongue protrudes centrally and palate elevates symmetrically, mild to  moderate mouth dryness noted.  Speech is clear without dysarthria, hypophonia or voice tremor.  No carotid bruits.  Neck is supple, no significant limitation in range of motion.   Chest: Clear to auscultation but distant breath sounds noted, no wheezing or crackles.    Heart: S1+S2+0, regular and normal without murmurs, rubs or gallops noted.   Abdomen: Soft, non-tender and non-distended with normal bowel sounds appreciated on auscultation.  Extremities: There is 1+ pitting edema in the distal left lower extremity, trace  edema in the distal right lower extremity.    Skin: Warm and dry, no obvious varicose veins.    Musculoskeletal: exam reveals no obvious joint deformities, tenderness or joint swelling or erythema.   Neurologically:  Mental status: The patient is awake, alert and oriented in all 4 spheres. His immediate and remote memory, attention, language skills and fund of knowledge are appropriate. There is no evidence of aphasia, agnosia, apraxia or anomia. Speech is clear with normal prosody and enunciation. Thought process is linear. Mood is normal and affect is normal.  Cranial nerves II - XII are as described above under HEENT exam. In addition: shoulder shrug is normal with equal shoulder height noted. Motor exam: Normal bulk but mild decrease in intrinsic hand muscles profile.  He has overall 4 out of 5 strength, weaker in both hip flexors, left leg a little stronger than right.  No complete foot drop, mild decrease in foot dorsiflexion bilaterally. Reflexes are trace in the upper extremities and absent in the lower extremities, toes are downgoing.  Fine motor skills are impaired in bilateral upper and lower extremities, no lateralization noted.  No decrement in amplitude noted.  ity.  Cerebellar testing: No dysmetria or intention tremor on finger to nose testing. Heel to shin is unremarkable bilaterally. There is no truncal or gait ataxia.  Sensory exam: Decreased to pinprick and  temperature sense in the lower extremities but interestingly more confined to the forearms but slightly better with pinprick in the fingers and fingertips.  Similarly, in the toes and distal feet he has slightly better pinprick sensation then in the foot itself, decreased sensation to temperature and pinprick in the foot and ankle areas.  Fairly preserved vibration sense throughout.   Gait, station and balance: He stands with mild difficulty, posture is age-appropriate but he does stand slightly wider base.  He has no walking aid.  He walks slowly and cautiously, no obvious foot drop, balance mildly impaired, Romberg testing he does have mild sway and does not feel comfortable.  Tandem walk is not tested due to safety concerns.  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  He reports a 16-month history of numbness affecting his distal arms bilaterally.  Symptoms were fairly abrupt in onset and unusual for typical acute neuropathies.  Differential diagnosis includes peripheral neuropathy with sudden progression, Guillain-Barr syndrome vs. CIDP present typically differently and often present with lower extremity symptoms first.  He reports no recent or significant change in his lower extremities.  He has mild fine motor dyscontrol and mild weakness and decreased pinprick and temperature sensation in both upper and lower extremities.  Differential diagnosis does include paraneoplastic neuropathy as well, given his history of lung cancer.  I would like to proceed with blood  work, repeat EMG nerve conduction velocity testing to compare 1 arm versus 1 leg.  Of note, he has been on gabapentin 600 mg 3 times daily for years.  He is advised to continue to work on smoking cessation.  He is furthermore advised regarding etiologies of neuropathy which can include of course diabetes but also  toxic neuropathies such as alcohol related neuropathy.  He is advised to reduce his daily alcohol consumption and try to limit himself to less than 1 g/day if possible.  Furthermore, I voiced concern regarding his driving a motor bike especially long distance.  He is cautioned and advised against going on a long trip at this time.  He is getting ready to take a longer trip, did not give me an actual date and is not currently receptive to not drive his motorbike.  We will proceed with blood work today and arrange for an EMG and nerve conduction velocity test through our office at a separate date.  We may proceed with a brain MRI given the unusual presentation, his brain MRI from November 2020 with and without contrast was nonrevealing.  We will keep him posted as to his test results by phone call for now and arrange for follow-up in his office accordingly.  This was an extended visit.  I answered all their questions today and the patient and his wife were in agreement.  Thank you very much for allowing me to participate in the care of this nice patient. If I can be of any further assistance to you please do not hesitate to call me at [phone].  Sincerely,   Star Age, MD, PhD  I spent 70 minutes in total face-to-face time and in reviewing records during pre-charting, more than 50% of which was spent in counseling and coordination of care, reviewing test results, reviewing medications and treatment regimen and/or in discussing or reviewing the diagnosis of neuropathy, the prognosis and treatment options. Pertinent laboratory and imaging test results that were available during this visit with  the patient were reviewed by me and considered in my medical decision making (see chart for details).

## 2020-12-09 NOTE — Patient Instructions (Addendum)
You may have a condition called peripheral neuropathy, i. e. nerve damage. Unfortunately, as I mentioned, there is no specific treatment for most neuropathies. The most common cause for neuropathy is diabetes in this [place], in which case, tight glucose control is key.  Some studies suggest that obesity and prediabetes can also cause nerve damage even in the absence of a formal diagnosis of diabetes.    Other causes include thyroid disease, and some vitamin deficiencies. Certain medications such as chemotherapy agents and other chemicals or toxins including alcohol can cause neuropathy. There are some genetic conditions or hereditary neuropathies. Typically patients will report a family history of neuropathy in those conditions. There are cases associated with cancers and autoimmune conditions. Most neuropathies are progressive unless a root cause can be found and treated, which is rare, as I explained. For most neuropathies there is no actual cure or reversing of symptoms. Painful neuropathy can be difficult to treat symptomatically, but there are some medications available to ease the symptoms.  Thankfully, you have no significant pain symptoms at this time and can be monitored for symptoms.    Electrophysiologic testing with nerve conduction velocity studies and EMG (muscle testing) do not always pick up neuropathies that affect the smallest fibers. Other common tests include different type of blood work, and rarely, spinal fluid testing, and sometimes we resort to asking for a nerve and muscle biopsy.  We can also consider specialist input from a neuromuscular specialist, sometimes we make referrals to [facility] or [facility] or [facility].  For now, as discussed, we will proceed with further work-up from my end of things:   We will check blood work today and call you with the test results.  We will do an EMG and nerve conduction velocity test, which is an electrical nerve and muscle test, which we  will schedule. We will call you with the results.  Please continue to work on complete smoking cessation.  Please reduce your daily alcohol consumption and try to drink less than 1 alcoholic beverage per day.  Try to increase her water intake, ideally between 6 to 8 cups of water per day are recommended, 8 ounce size each.   We will plan to follow-up after your testing.

## 2020-12-10 LAB — TSH: TSH: 1.33 u[IU]/mL (ref 0.450–4.500)

## 2020-12-10 LAB — HEAVY METALS PROFILE II, BLOOD

## 2020-12-10 LAB — MULTIPLE MYELOMA PANEL, SERUM

## 2020-12-11 LAB — COPPER, SERUM: Copper: 127 ug/dL (ref 69–132)

## 2020-12-11 LAB — HEAVY METALS PROFILE II, BLOOD: Lead, Blood: 1 ug/dL (ref 0–4)

## 2020-12-11 LAB — MULTIPLE MYELOMA PANEL, SERUM

## 2020-12-12 ENCOUNTER — Encounter: Payer: Self-pay | Admitting: Neurology

## 2020-12-12 LAB — SEDIMENTATION RATE: Sed Rate: 3 mm/hr (ref 0–30)

## 2020-12-12 LAB — MULTIPLE MYELOMA PANEL, SERUM: Globulin, Total: 2.7 g/dL (ref 2.2–3.9)

## 2020-12-18 LAB — MULTIPLE MYELOMA PANEL, SERUM
Albumin SerPl Elph-Mcnc: 3.8 g/dL (ref 2.9–4.4)
Alpha 1: 0.3 g/dL (ref 0.0–0.4)
Gamma Glob SerPl Elph-Mcnc: 0.7 g/dL (ref 0.4–1.8)
IgG (Immunoglobin G), Serum: 827 mg/dL (ref 603–1613)
IgM (Immunoglobulin M), Srm: 62 mg/dL (ref 15–143)
Total Protein: 6.5 g/dL (ref 6.0–8.5)

## 2020-12-18 LAB — HGB A1C W/O EAG: Hgb A1c MFr Bld: 5.1 % (ref 4.8–5.6)

## 2020-12-18 LAB — VITAMIN B1: Thiamine: 193.3 nmol/L (ref 66.5–200.0)

## 2020-12-18 LAB — HEAVY METALS PROFILE II, BLOOD
Arsenic: <1 ug/L (ref 0–9)
Mercury: <1 ug/L (ref 0.0–14.9)

## 2020-12-18 LAB — CK: Total CK: 51 U/L (ref 41–331)

## 2020-12-18 LAB — C-REACTIVE PROTEIN: CRP: 6 mg/L (ref 0–10)

## 2020-12-18 LAB — RPR: RPR Ser Ql: NONREACTIVE

## 2020-12-18 LAB — RHEUMATOID FACTOR: Rhuematoid fact SerPl-aCnc: <10 IU/mL (ref <14.0)

## 2020-12-18 LAB — ANA W/REFLEX: Anti Nuclear Antibody (ANA): NEGATIVE

## 2020-12-18 LAB — VITAMIN B6: Vitamin B6: 45.6 ug/L (ref 5.3–46.7)

## 2020-12-18 LAB — AMMONIA: Ammonia: 30 ug/dL — ABNORMAL LOW (ref 31–169)

## 2020-12-18 NOTE — Progress Notes (Signed)
Labs were benign.  Please update patient.

## 2020-12-19 ENCOUNTER — Telehealth: Payer: Self-pay

## 2020-12-19 NOTE — Telephone Encounter (Signed)
-----   Message from Star Age, MD sent at 12/18/2020  4:56 PM EDT ----- Labs were benign.  Please update patient.

## 2020-12-19 NOTE — Telephone Encounter (Signed)
Pt advised of results and verbalized understanding.

## 2020-12-24 ENCOUNTER — Telehealth: Payer: Self-pay

## 2020-12-24 NOTE — Telephone Encounter (Signed)
Received fax from [employer] stating order from our office was received and the appropriate blood draw kit will be sent to the pt's home and arrangements will be made.  This was a FYI only 

## 2020-12-25 ENCOUNTER — Telehealth: Payer: Self-pay | Admitting: Neurology

## 2020-12-25 NOTE — Telephone Encounter (Signed)
Ok, thank you

## 2020-12-25 NOTE — Telephone Encounter (Signed)
FYI: Pt called, need to cancel Nerve Conduction, not feeling good at all. Do not want to get anyone else sick. I will call back to reschedule when I feel better.

## 2020-12-26 ENCOUNTER — Encounter: Payer: Medicare Other | Admitting: Diagnostic Neuroimaging

## 2021-01-20 NOTE — Telephone Encounter (Signed)
[name] from [employer] left a voicemail advising [place] that there was a positive result for the paraneoplastic evaluation. If the results have not been received or if there are questions please call [phone] opt 2.

## 2021-01-20 NOTE — Telephone Encounter (Signed)
Results have been received and given to Dr. Rexene Alberts to review.

## 2021-02-04 ENCOUNTER — Telehealth: Payer: Self-pay | Admitting: Neurology

## 2021-02-04 DIAGNOSIS — C3492 Malignant neoplasm of unspecified part of left bronchus or lung: Secondary | ICD-10-CM

## 2021-02-04 DIAGNOSIS — G6281 Critical illness polyneuropathy: Secondary | ICD-10-CM

## 2021-02-04 DIAGNOSIS — G988 Other disorders of nervous system: Secondary | ICD-10-CM

## 2021-02-04 DIAGNOSIS — C801 Malignant (primary) neoplasm, unspecified: Secondary | ICD-10-CM

## 2021-02-04 NOTE — Telephone Encounter (Signed)
I called pt. No answer, left a message asking pt to call me back.   

## 2021-02-04 NOTE — Telephone Encounter (Signed)
I called pt and we had an extended conversation about blood tests.  Pt is agreeable to further testing for the MRI, NCS/EMG and EEG.  EEG, NCS/EMG appt has been scheduled advised once MRI coordinator gets authorization from insurance he will be called to schedule.  Pt was advised once the results of testing come back we will call to review.

## 2021-02-04 NOTE — Telephone Encounter (Signed)
Please call patient regarding his [facility] [facility] send out blood test results.  This is a panel of tests called we will complete paraneoplastic evaluation.  They test a number of markers in the blood, out of the multiple blood markers, 2 came back positive, which is abnormal.  These are markers that can be associated with an underlying cancer such as lung cancer.  I have not heard of these 2 markers that were positive in his case before and had to do some research and also consult verbally with one of my colleagues, Dr. Jannifer Franklin. We mutually agreed that there is the possibility that one of the markers called anti-CASPR2 is associated with neuropathy in some cases but also balance issues and brain dysfunction potentially.  The other marker that came back positive is called anti-alpha 3AChR and may be associated with dysautonomia, for example blood pressure dysregulation and dysregulation with other bodily functions.  I would like to proceed with additional testing, first of all I would like for him to reschedule his EMG nerve conduction velocity test with our office as he canceled the appointment in March.  I would like to do a brain MRI with and without contrast and an EEG which is an electrical brainwave test, we will do this in our office if he is agreeable.   I am unfortunately not familiar with treatment options and if there are any potential treatment options for these syndromes associated with these markers.  If he is agreeable, would like to order the brain MRI, the EEG and we can reschedule the EMG and make an appointment for discussion afterwards.  He may benefit from seeing a specialist such as a neuromuscular specialist at one of the academic centers.   Some antibody related syndromes improve once the underlying cancer is treated.  Please let me know if he is agreeable to proceeding with additional testing as mentioned above and we we will plan a follow-up soon afterwards.

## 2021-02-04 NOTE — Telephone Encounter (Signed)
Pt has called Jinny Blossom, RN back please call.

## 2021-02-06 ENCOUNTER — Ambulatory Visit (INDEPENDENT_AMBULATORY_CARE_PROVIDER_SITE_OTHER): Payer: Medicare Other | Admitting: Diagnostic Neuroimaging

## 2021-02-06 ENCOUNTER — Encounter (INDEPENDENT_AMBULATORY_CARE_PROVIDER_SITE_OTHER): Payer: Medicare Other | Admitting: Diagnostic Neuroimaging

## 2021-02-06 DIAGNOSIS — D649 Anemia, unspecified: Secondary | ICD-10-CM

## 2021-02-06 DIAGNOSIS — M51369 Other intervertebral disc degeneration, lumbar region without mention of lumbar back pain or lower extremity pain: Secondary | ICD-10-CM

## 2021-02-06 DIAGNOSIS — G608 Other hereditary and idiopathic neuropathies: Secondary | ICD-10-CM | POA: Diagnosis not present

## 2021-02-06 DIAGNOSIS — R29898 Other symptoms and signs involving the musculoskeletal system: Secondary | ICD-10-CM

## 2021-02-06 DIAGNOSIS — Z0289 Encounter for other administrative examinations: Secondary | ICD-10-CM

## 2021-02-06 DIAGNOSIS — N281 Cyst of kidney, acquired: Secondary | ICD-10-CM

## 2021-02-06 DIAGNOSIS — C3492 Malignant neoplasm of unspecified part of left bronchus or lung: Secondary | ICD-10-CM

## 2021-02-06 DIAGNOSIS — M5136 Other intervertebral disc degeneration, lumbar region: Secondary | ICD-10-CM

## 2021-02-06 DIAGNOSIS — F17200 Nicotine dependence, unspecified, uncomplicated: Secondary | ICD-10-CM

## 2021-02-06 DIAGNOSIS — G629 Polyneuropathy, unspecified: Secondary | ICD-10-CM

## 2021-02-06 DIAGNOSIS — Z789 Other specified health status: Secondary | ICD-10-CM

## 2021-02-06 NOTE — Procedures (Signed)
[facility]  NCS (NERVE CONDUCTION STUDY) WITH EMG (ELECTROMYOGRAPHY) REPORT   STUDY DATE: 02/06/21 PATIENT NAME: [name] DOB: Jan 28, 1951 MRN: [id number]  ORDERING CLINICIAN: Star Age, MD PhD   TECHNOLOGIST: Sherre Scarlet ELECTROMYOGRAPHER: Earlean Polka. Jovani Flury, MD  CLINICAL INFORMATION: 70 year old male with numbness and weakness.  Symptoms started in January 2022 with peak of symptoms within 4 to 6 weeks.  Since that time symptoms gradually improving.  Prior EMG nerve conduction study demonstrated axonal and demyelinating neuropathy.  Evaluate follow-up study.  FINDINGS: NERVE CONDUCTION STUDY:  Right median motor responses normal distal latency, normal amplitude, slow conduction velocity (37 m/s).  Right ulnar motor response has prolonged distal latency (6.4 ms) decreased amplitude and slow conduction velocity (11 m/s with stimulation above the elbow and 38 m/s and stimulation below the elbow).  Right tibial and peroneal motor responses abnormal distal latencies, decreased amplitudes and slow conduction velocities.  Temporal dispersion noted within the right ulnar and right tibial motor responses.  Right ulnar F-wave latency is significantly prolonged.  Right tibial F-wave latency is prolonged.  Right median, right ulnar, right superficial peroneal sensory responses could not be obtained.  Right radial sensory response has decreased amplitude.   NEEDLE ELECTROMYOGRAPHY:  Needle examination of bilateral upper extremities shows chronic denervation in right deltoid, right first dorsal interosseous, right flexor carpi ulnaris and left first dorsal interosseous muscles.   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  Given the clinical context this could represent a prior acute inflammatory neuropathy (such as  Guillain-Barr syndrome) now in the resolving phase.    INTERPRETING PHYSICIAN:  Penni Bombard, MD Certified in Neurology, Neurophysiology and Neuroimaging  [facility] [address] [place], [place] [address] [phone]  [facility]    Nerve / Sites Muscle Latency Ref. Amplitude Ref. Rel Amp Segments Distance Velocity Ref. Area    ms ms mV mV %  cm m/s m/s mVms  R Median - APB     Wrist APB 4.4 ?4.4 4.9 ?4.0 100 Wrist - APB 7   32.5     Upper arm APB 11.4  4.7  96.1 Upper arm - Wrist 26 37 ?49 29.6  R Ulnar - ADM     Wrist ADM 6.4 ?3.3 0.5 ?6.0 100 Wrist - ADM 7   1.6     B.Elbow ADM 12.2  0.4  71.8 B.Elbow - Wrist 22 38 ?49 2.6     A.Elbow ADM 21.1  0.4  115 A.Elbow - B.Elbow 10 11 ?49 3.9  R Peroneal - EDB     Ankle EDB 6.5 ?6.5 0.4 ?2.0 100 Ankle - EDB 9   1.3     Fib head EDB 15.5  0.3  88.3 Fib head - Ankle 34 38 ?44 1.3     Pop fossa EDB 18.3  0.3  97.8 Pop fossa - Fib head 10 36 ?44 0.9         Pop fossa - Ankle      R Tibial - AH     Ankle AH 3.9 ?5.8 1.9 ?4.0 100 Ankle - AH 9   6.5     Pop fossa AH 18.8  1.2  61.5 Pop fossa - Ankle 44 30 ?41 4.3             SNC    Nerve / Sites Rec. Site Peak  Lat Ref.  Amp Ref. Segments Distance    ms ms V V  cm  R Radial - Anatomical snuff box (Forearm)     Forearm Wrist 2.4 ?2.9 9 ?15 Forearm - Wrist 10  R Superficial peroneal - Ankle     Lat leg Ankle NR ?4.4 NR ?6 Lat leg - Ankle 14  R Median - Orthodromic (Dig II, Mid palm)     Dig II Wrist NR ?3.4 NR ?10 Dig II - Wrist 13  R Ulnar - Orthodromic, (Dig V, Mid palm)     Dig V Wrist NR ?3.1 NR ?5 Dig V - Wrist 24               F  Wave    Nerve F Lat Ref.   ms ms  R Tibial - AH 68.3 ?56.0  R Ulnar - ADM 110.8 ?32.0         EMG Summary Table    Spontaneous MUAP Recruitment  Muscle IA Fib PSW Fasc Other Amp Dur. Poly Pattern  R. Deltoid Normal None None None _______ Increased Normal Normal Reduced  R. Biceps brachii Normal None None None  _______ Normal Normal Normal Normal  R. Triceps brachii Normal None None None _______ Normal Normal Normal Normal  R. Flexor carpi radialis Normal None None None _______ Normal Normal Normal Normal  R. Flexor carpi ulnaris Normal 1+ None None _______ Increased Normal Normal Reduced  R. First dorsal interosseous Normal None None None _______ Increased Normal Normal Reduced  L. First dorsal interosseous Normal None None None _______ Increased Normal Normal Reduced

## 2021-02-10 ENCOUNTER — Other Ambulatory Visit: Payer: Medicare Other

## 2021-02-10 ENCOUNTER — Telehealth: Payer: Self-pay | Admitting: Neurology

## 2021-02-10 NOTE — Telephone Encounter (Signed)
Noted, thanks!

## 2021-02-10 NOTE — Telephone Encounter (Signed)
Pt called, I'm feeling sick with runny nose, sore throat need to cancel. Will call back to reschedule.

## 2021-02-11 NOTE — Addendum Note (Signed)
Addended by: Star Age on: 02/11/2021 10:16 AM   Modules accepted: Orders

## 2021-02-11 NOTE — Progress Notes (Signed)
See phone note

## 2021-02-11 NOTE — Telephone Encounter (Signed)
I have ordered an EEG and brain MRI.  Please also advise patient that the recent EMG and nerve conduction velocity test which is the electrical nerve and muscle test showed evidence of a recent acute nerve damage.  We can continue to monitor his symptoms since he may be on the upswing of things.  He if he has acute worsening of weakness or numbness or severe pain, he has to go to the emergency room immediately.

## 2021-02-12 ENCOUNTER — Telehealth: Payer: Self-pay | Admitting: Neurology

## 2021-02-12 NOTE — Telephone Encounter (Signed)
Pt advised of results and next steps. Pt verbalized understanding and is agreeable to further testing.  EEG scheduled for June, MRI will be scheduled once auth is received.

## 2021-02-12 NOTE — Telephone Encounter (Signed)
Patient's MRI Brain w/wo contrast sent to [facility] 5/17. No auth for Medicare A & B/Tricare.

## 2021-02-26 ENCOUNTER — Other Ambulatory Visit: Payer: Self-pay

## 2021-02-26 ENCOUNTER — Ambulatory Visit
Admission: RE | Admit: 2021-02-26 | Discharge: 2021-02-26 | Disposition: A | Discharge status: Home / Self Care | Payer: Medicare Other | Source: Ambulatory Visit | Attending: Neurology | Admitting: Neurology

## 2021-02-26 DIAGNOSIS — G6281 Critical illness polyneuropathy: Secondary | ICD-10-CM

## 2021-02-26 DIAGNOSIS — C801 Malignant (primary) neoplasm, unspecified: Secondary | ICD-10-CM

## 2021-02-26 DIAGNOSIS — C3492 Malignant neoplasm of unspecified part of left bronchus or lung: Secondary | ICD-10-CM

## 2021-02-26 DIAGNOSIS — G988 Other disorders of nervous system: Secondary | ICD-10-CM

## 2021-02-26 MED ORDER — GADOBENATE DIMEGLUMINE 529 MG/ML IV SOLN
20.0000 mL | Freq: Once | INTRAVENOUS | Status: AC | PRN
  Administered 2021-02-26: 20 mL via INTRAVENOUS

## 2021-02-27 ENCOUNTER — Other Ambulatory Visit: Payer: Self-pay

## 2021-02-27 ENCOUNTER — Ambulatory Visit (INDEPENDENT_AMBULATORY_CARE_PROVIDER_SITE_OTHER): Payer: Medicare Other | Admitting: Diagnostic Neuroimaging

## 2021-02-27 DIAGNOSIS — G988 Other disorders of nervous system: Secondary | ICD-10-CM

## 2021-02-27 DIAGNOSIS — R2990 Unspecified symptoms and signs involving the nervous system: Secondary | ICD-10-CM

## 2021-02-27 DIAGNOSIS — G6281 Critical illness polyneuropathy: Secondary | ICD-10-CM

## 2021-02-27 DIAGNOSIS — C801 Malignant (primary) neoplasm, unspecified: Secondary | ICD-10-CM

## 2021-02-27 DIAGNOSIS — C3492 Malignant neoplasm of unspecified part of left bronchus or lung: Secondary | ICD-10-CM

## 2021-03-04 NOTE — Progress Notes (Signed)
Please call patient and advise him that his brain MRI did not show any acute findings and no abnormal contrast uptake or mass thankfully.  Chronic changes were seen, mostly in the age-appropriate range, chronic changes with respect sinus congestion.  No acute sinusitis type changes.  Overall fairly reassuring findings.

## 2021-03-05 ENCOUNTER — Telehealth: Payer: Self-pay | Admitting: *Deleted

## 2021-03-05 NOTE — Telephone Encounter (Signed)
Spoke with patient and informed him that his brain MRI did not show any acute findings and no abnormal contrast uptake or mass thankfully. Chronic changes were seen, mostly in the age-appropriate range, chronic changes with respect sinus congestion. No acute sinusitis type changes. Overall fairly reassuring findings. Patient verbalized understanding, appreciation.

## 2021-03-19 NOTE — Procedures (Signed)
   [facility]  EEG (ELECTROENCEPHALOGRAM) REPORT   STUDY DATE: 02/27/21 PATIENT NAME: [name] DOB: [birth year] MRN: [id number]  ORDERING CLINICIAN: Star Age, MD PhD   TECHNOLOGIST: Babs Bertin TECHNIQUE: Electroencephalogram was recorded utilizing standard 10-20 system of lead placement and reformatted into average and bipolar montages.  RECORDING TIME: 26 minutes  ACTIVATION: hyperventilation and photic stimulation  CLINICAL INFORMATION: 70 year old male with abnormal paraneoplastic lab results   FINDINGS: Posterior dominant background rhythms, which attenuate with eye opening, ranging 9-11 hertz and 35-40 microvolts. No focal, lateralizing, epileptiform activity or seizures are seen. Patient recorded in the awake and drowsy state. EKG channel shows regular rhythm of 70-80 beats per minute.   IMPRESSION:   Normal EEG in the awake and drowsy states.   INTERPRETING PHYSICIAN:  Penni Bombard, MD Certified in Neurology, Neurophysiology and Neuroimaging  [facility] [address], [place], [place] [address] [phone]

## 2021-03-20 ENCOUNTER — Telehealth: Payer: Self-pay

## 2021-03-20 NOTE — Telephone Encounter (Signed)
I called the pt and left a vm ( ok per dpr) of EEG results.  Pt advised to CB if he had any questions/concerns.   Pt advised to cb and schedule a 3-6 month f/u appt.

## 2021-03-20 NOTE — Telephone Encounter (Signed)
-----   Message from Star Age, MD sent at 03/20/2021  7:15 AM EDT ----- Please call and advise the patient that the EEG or brain wave test we performed was reported as normal in the awake and drowsy states. We checked for abnormal electrical discharges in the brain waves and the report suggested normal findings. No further action is required on this test at this time. Please remind patient to keep any upcoming appointments or tests and to call us with any interim questions, concerns, problems or updates. Thanks,  Star Age, MD, PhD

## 2021-10-23 ENCOUNTER — Other Ambulatory Visit: Payer: Self-pay | Admitting: Neurological Surgery

## 2021-10-23 DIAGNOSIS — M5416 Radiculopathy, lumbar region: Secondary | ICD-10-CM

## 2021-10-27 ENCOUNTER — Other Ambulatory Visit: Payer: Self-pay

## 2021-10-27 ENCOUNTER — Ambulatory Visit (INDEPENDENT_AMBULATORY_CARE_PROVIDER_SITE_OTHER): Payer: Medicare Other

## 2021-10-27 DIAGNOSIS — Z85118 Personal history of other malignant neoplasm of bronchus and lung: Secondary | ICD-10-CM | POA: Diagnosis not present

## 2021-10-27 DIAGNOSIS — M5416 Radiculopathy, lumbar region: Secondary | ICD-10-CM

## 2021-10-27 DIAGNOSIS — M545 Low back pain, unspecified: Secondary | ICD-10-CM

## 2021-10-27 MED ORDER — GADOBUTROL 1 MMOL/ML IV SOLN
10.0000 mL | Freq: Once | INTRAVENOUS | Status: AC | PRN
  Administered 2021-10-27: 10 mL via INTRAVENOUS

## 2021-11-07 ENCOUNTER — Other Ambulatory Visit: Payer: Self-pay | Admitting: Neurological Surgery

## 2021-11-19 NOTE — Pre-Procedure Instructions (Signed)
Surgical Instructions    Your procedure is scheduled on Monday 11/24/21.   Report to [facility] Main Entrance "A" at 05:30 A.M., then check in with the Admitting office.  Call this number if you have problems the morning of surgery:  [phone]   If you have any questions prior to your surgery date call [phone]: Open Monday-Friday 8am-4pm    Remember:  Do not eat or drink after midnight the night before your surgery    Take these medicines the morning of surgery with A SIP OF WATER:   gabapentin (NEURONTIN)  Potassium    acetaminophen (TYLENOL)- If needed  albuterol (VENTOLIN HFA) 108 (90 Base)- If needed   Please follow your surgeon's instructions regarding clopidogrel (PLAVIX) and Aspirin. If you have not received instructions then please contact your surgeon's office for instructions.   As of today, STOP taking any Aspirin (unless otherwise instructed by your surgeon) Aleve, Naproxen, Ibuprofen, Motrin, Advil, Goody's, BC's, all herbal medications, fish oil, and all vitamins.           Do not wear jewelry or makeup Do not wear lotions, powders, perfumes/colognes, or deodorant. Do not shave 48 hours prior to surgery.  Men may shave face and neck. Do not bring valuables to the hospital. Do not wear nail polish, gel polish, artificial nails, or any other type of covering on natural nails (fingers and toes) If you have artificial nails or gel coating that need to be removed by a nail salon, please have this removed prior to surgery. Artificial nails or gel coating may interfere with anesthesia's ability to adequately monitor your vital signs.  [facility] is not responsible for any belongings or valuables. .   Do NOT Smoke (Tobacco/Vaping)  24 hours prior to your procedure  If you use a CPAP at night, you may bring your mask for your overnight stay.   Contacts, glasses, hearing aids, dentures or partials may not be worn into surgery, please bring cases for these  belongings   For patients admitted to the hospital, discharge time will be determined by your treatment team.   Patients discharged the day of surgery will not be allowed to drive home, and someone needs to stay with them for 24 hours.  NO VISITORS WILL BE ALLOWED IN PRE-OP WHERE PATIENTS ARE PREPPED FOR SURGERY.  ONLY 1 SUPPORT PERSON MAY BE PRESENT IN THE WAITING ROOM WHILE YOU ARE IN SURGERY.  IF YOU ARE TO BE ADMITTED, ONCE YOU ARE IN YOUR ROOM YOU WILL BE ALLOWED TWO (2) VISITORS. 1 (ONE) VISITOR MAY STAY OVERNIGHT BUT MUST ARRIVE TO THE ROOM BY 8pm.  Minor children may have two parents present. Special consideration for safety and communication needs will be reviewed on a case by case basis.  Special instructions:    Oral Hygiene is also important to reduce your risk of infection.  Remember - BRUSH YOUR TEETH THE MORNING OF SURGERY WITH YOUR REGULAR TOOTHPASTE   [facility]- Preparing For Surgery  Before surgery, you can play an important role. Because skin is not sterile, your skin needs to be as free of germs as possible. You can reduce the number of germs on your skin by washing with CHG (chlorahexidine gluconate) Soap before surgery.  CHG is an antiseptic cleaner which kills germs and bonds with the skin to continue killing germs even after washing.     Please do not use if you have an allergy to CHG or antibacterial soaps. If your skin becomes reddened/irritated  stop using the CHG.  Do not shave (including legs and underarms) for at least 48 hours prior to first CHG shower. It is OK to shave your face.  Please follow these instructions carefully.     Shower the NIGHT BEFORE SURGERY and the MORNING OF SURGERY with CHG Soap.   If you chose to wash your hair, wash your hair first as usual with your normal shampoo. After you shampoo, rinse your hair and body thoroughly to remove the shampoo.  Then [employer] and genitals (private parts) with your normal soap and rinse thoroughly to  remove soap.  After that Use CHG Soap as you would any other liquid soap. You can apply CHG directly to the skin and wash gently with a scrungie or a clean washcloth.   Apply the CHG Soap to your body ONLY FROM THE NECK DOWN.  Do not use on open wounds or open sores. Avoid contact with your eyes, ears, mouth and genitals (private parts). Wash Face and genitals (private parts)  with your normal soap.   Wash thoroughly, paying special attention to the area where your surgery will be performed.  Thoroughly rinse your body with warm water from the neck down.  DO NOT shower/wash with your normal soap after using and rinsing off the CHG Soap.  Pat yourself dry with a CLEAN TOWEL.  Wear CLEAN PAJAMAS to bed the night before surgery  Place CLEAN SHEETS on your bed the night before your surgery  DO NOT SLEEP WITH PETS.   Day of Surgery:  Take a shower with CHG soap. Wear Clean/Comfortable clothing the morning of surgery Do not apply any deodorants/lotions.   Remember to brush your teeth WITH YOUR REGULAR TOOTHPASTE.    COVID testing  If you are going to stay overnight or be admitted after your procedure/surgery and require a pre-op COVID test, please follow these instructions after your COVID test   You are not required to quarantine however you are required to wear a well-fitting mask when you are out and around people not in your household.  If your mask becomes wet or soiled, replace with a new one.  Wash your hands often with soap and water for 20 seconds or clean your hands with an alcohol-based hand sanitizer that contains at least 60% alcohol.  Do not share personal items.  Notify your provider: if you are in close contact with someone who has COVID  or if you develop a fever of 100.4 or greater, sneezing, cough, sore throat, shortness of breath or body aches.    Please read over the following fact sheets that you were given.

## 2021-11-20 ENCOUNTER — Encounter (HOSPITAL_COMMUNITY)
Admission: RE | Admit: 2021-11-20 | Discharge: 2021-11-20 | Disposition: A | Discharge status: Home / Self Care | Payer: Medicare Other | Source: Ambulatory Visit | Attending: Neurological Surgery | Admitting: Neurological Surgery

## 2021-11-20 ENCOUNTER — Encounter (HOSPITAL_COMMUNITY): Payer: Self-pay

## 2021-11-20 ENCOUNTER — Other Ambulatory Visit: Payer: Self-pay

## 2021-11-20 VITALS — BP 160/77 | HR 84 | Temp 98.3°F | Resp 18 | Ht 72.0 in | Wt 221.0 lb

## 2021-11-20 DIAGNOSIS — Z20822 Contact with and (suspected) exposure to covid-19: Secondary | ICD-10-CM | POA: Diagnosis not present

## 2021-11-20 DIAGNOSIS — I10 Essential (primary) hypertension: Secondary | ICD-10-CM | POA: Diagnosis not present

## 2021-11-20 DIAGNOSIS — Z01818 Encounter for other preprocedural examination: Secondary | ICD-10-CM | POA: Diagnosis present

## 2021-11-20 HISTORY — DX: Pneumonia, unspecified organism: J18.9

## 2021-11-20 LAB — BASIC METABOLIC PANEL
Anion gap: 8 (ref 5–15)
BUN: 9 mg/dL (ref 8–23)
CO2: 30 mmol/L (ref 22–32)
Calcium: 8.9 mg/dL (ref 8.9–10.3)
Chloride: 99 mmol/L (ref 98–111)
Creatinine, Ser: 0.8 mg/dL (ref 0.61–1.24)
GFR, Estimated: >60 mL/min (ref >60)
Glucose, Bld: 92 mg/dL (ref 70–99)
Potassium: 3.8 mmol/L (ref 3.5–5.1)
Sodium: 137 mmol/L (ref 135–145)

## 2021-11-20 LAB — TYPE AND SCREEN
ABO/RH(D): O POS
Antibody Screen: NEGATIVE

## 2021-11-20 LAB — CBC
HCT: 43.6 % (ref 39.0–52.0)
Hemoglobin: 14.5 g/dL (ref 13.0–17.0)
MCH: 31 pg (ref 26.0–34.0)
MCHC: 33.3 g/dL (ref 30.0–36.0)
MCV: 93.4 fL (ref 80.0–100.0)
Platelets: 166 10*3/uL (ref 150–400)
RBC: 4.67 MIL/uL (ref 4.22–5.81)
RDW: 14.1 % (ref 11.5–15.5)
WBC: 6.3 10*3/uL (ref 4.0–10.5)
nRBC: 0 % (ref 0.0–0.2)

## 2021-11-20 LAB — PROTIME-INR
INR: 1 (ref 0.8–1.2)
Prothrombin Time: 13 seconds (ref 11.4–15.2)

## 2021-11-20 LAB — SURGICAL PCR SCREEN
MRSA, PCR: NEGATIVE
Staphylococcus aureus: NEGATIVE

## 2021-11-20 LAB — SARS CORONAVIRUS 2 (TAT 6-24 HRS): SARS Coronavirus 2: NEGATIVE

## 2021-11-20 NOTE — Progress Notes (Signed)
PCP - Julie Martinique NP Cardiologist - Denies Urology: Dr. Kallie Locks Neurology: Dr. Star Age  PPM/ICD - Denies  Chest x-ray - N/A EKG - 11/20/21 Stress Test - Denies ECHO - Denies Cardiac Cath - Denies  Sleep Study - OSA CPAP - Yes  Patient denies having diabetes.  Blood Thinner Instructions: Follow Surgeon's instructions. Aspirin Instructions: LD: 11/16/21  ERAS Protcol - No  COVID TEST- 11/20/21 in PAT   Anesthesia review: No  Patient denies shortness of breath, fever, cough and chest pain at PAT appointment   All instructions explained to the patient, with a verbal understanding of the material. Patient agrees to go over the instructions while at home for a better understanding. Patient also instructed to self quarantine after being tested for COVID-19. The opportunity to ask questions was provided.

## 2021-11-23 NOTE — Anesthesia Preprocedure Evaluation (Addendum)
Anesthesia Evaluation  Patient identified by MRN, date of birth, ID band Patient awake    Reviewed: Allergy & Precautions, NPO status , Patient's Chart, lab work & pertinent test results  History of Anesthesia Complications Negative for: history of anesthetic complications  Airway Mallampati: I  TM Distance: >3 FB Neck ROM: Full    Dental  (+) Edentulous Upper, Edentulous Lower   Pulmonary sleep apnea, Continuous Positive Airway Pressure Ventilation and Oxygen sleep apnea , COPD,  COPD inhaler and oxygen dependent, Current Smoker and Patient abstained from smoking.,  11/20/2021 SARS coronavirus NEG S/p LU Lobectomy   breath sounds clear to auscultation       Cardiovascular hypertension, Pt. on medications + Peripheral Vascular Disease   Rhythm:Regular Rate:Normal     Neuro/Psych negative neurological ROS     GI/Hepatic negative GI ROS, Neg liver ROS,   Endo/Other  negative endocrine ROS  Renal/GU negative Renal ROS     Musculoskeletal  (+) Arthritis , Osteoarthritis,    Abdominal   Peds  Hematology negative hematology ROS (+)   Anesthesia Other Findings   Reproductive/Obstetrics                           Anesthesia Physical Anesthesia Plan  ASA: 3  Anesthesia Plan: General   Post-op Pain Management: Tylenol PO (pre-op)*   Induction: Intravenous  PONV Risk Score and Plan: 1 and Ondansetron and Dexamethasone  Airway Management Planned: Oral ETT  Additional Equipment: None  Intra-op Plan:   Post-operative Plan: Extubation in OR  Informed Consent: I have reviewed the patients History and Physical, chart, labs and discussed the procedure including the risks, benefits and alternatives for the proposed anesthesia with the patient or authorized representative who has indicated his/her understanding and acceptance.       Plan Discussed with: CRNA and Surgeon  Anesthesia Plan  Comments:        Anesthesia Quick Evaluation

## 2021-11-24 ENCOUNTER — Inpatient Hospital Stay (HOSPITAL_COMMUNITY): Payer: Medicare Other

## 2021-11-24 ENCOUNTER — Other Ambulatory Visit: Payer: Self-pay

## 2021-11-24 ENCOUNTER — Encounter (HOSPITAL_COMMUNITY): Payer: Self-pay | Admitting: Neurological Surgery

## 2021-11-24 ENCOUNTER — Encounter (HOSPITAL_COMMUNITY)
Admission: RE | Disposition: A | Discharge status: Home / Self Care | Payer: Self-pay | Source: Home / Self Care | Attending: Neurological Surgery

## 2021-11-24 ENCOUNTER — Observation Stay (HOSPITAL_COMMUNITY)
Admission: RE | Admit: 2021-11-24 | Discharge: 2021-11-24 | Disposition: A | Discharge status: Home / Self Care | Payer: Medicare Other | Attending: Neurological Surgery | Admitting: Neurological Surgery

## 2021-11-24 ENCOUNTER — Inpatient Hospital Stay (HOSPITAL_COMMUNITY): Payer: Medicare Other | Admitting: Physician Assistant

## 2021-11-24 ENCOUNTER — Inpatient Hospital Stay (HOSPITAL_COMMUNITY): Payer: Medicare Other | Admitting: Certified Registered Nurse Anesthetist

## 2021-11-24 DIAGNOSIS — G4733 Obstructive sleep apnea (adult) (pediatric): Secondary | ICD-10-CM | POA: Diagnosis not present

## 2021-11-24 DIAGNOSIS — Z96643 Presence of artificial hip joint, bilateral: Secondary | ICD-10-CM | POA: Diagnosis not present

## 2021-11-24 DIAGNOSIS — I10 Essential (primary) hypertension: Secondary | ICD-10-CM | POA: Diagnosis not present

## 2021-11-24 DIAGNOSIS — F17210 Nicotine dependence, cigarettes, uncomplicated: Secondary | ICD-10-CM | POA: Diagnosis not present

## 2021-11-24 DIAGNOSIS — J449 Chronic obstructive pulmonary disease, unspecified: Secondary | ICD-10-CM | POA: Insufficient documentation

## 2021-11-24 DIAGNOSIS — Z7901 Long term (current) use of anticoagulants: Secondary | ICD-10-CM | POA: Insufficient documentation

## 2021-11-24 DIAGNOSIS — Z79899 Other long term (current) drug therapy: Secondary | ICD-10-CM | POA: Insufficient documentation

## 2021-11-24 DIAGNOSIS — M4726 Other spondylosis with radiculopathy, lumbar region: Secondary | ICD-10-CM | POA: Diagnosis not present

## 2021-11-24 DIAGNOSIS — M48061 Spinal stenosis, lumbar region without neurogenic claudication: Principal | ICD-10-CM | POA: Insufficient documentation

## 2021-11-24 DIAGNOSIS — M5416 Radiculopathy, lumbar region: Secondary | ICD-10-CM | POA: Diagnosis not present

## 2021-11-24 DIAGNOSIS — M4316 Spondylolisthesis, lumbar region: Secondary | ICD-10-CM | POA: Diagnosis not present

## 2021-11-24 DIAGNOSIS — M7138 Other bursal cyst, other site: Secondary | ICD-10-CM | POA: Insufficient documentation

## 2021-11-24 DIAGNOSIS — Z981 Arthrodesis status: Secondary | ICD-10-CM

## 2021-11-24 DIAGNOSIS — Z419 Encounter for procedure for purposes other than remedying health state, unspecified: Secondary | ICD-10-CM

## 2021-11-24 SURGERY — POSTERIOR LUMBAR FUSION 2 LEVEL
Anesthesia: General | Site: Back

## 2021-11-24 MED ORDER — LOSARTAN POTASSIUM 50 MG PO TABS: 50.0000 mg | ORAL_TABLET | Freq: Every morning | ORAL | Status: DC

## 2021-11-24 MED ORDER — DEXAMETHASONE SODIUM PHOSPHATE 4 MG/ML IJ SOLN: 4.0000 mg | Freq: Four times a day (QID) | INTRAMUSCULAR | Status: DC

## 2021-11-24 MED ORDER — ONDANSETRON HCL 4 MG/2ML IJ SOLN
INTRAMUSCULAR | Status: DC | PRN
  Administered 2021-11-24: 4 mg via INTRAVENOUS

## 2021-11-24 MED ORDER — BIOTIN 2.5 MG PO TABS: ORAL_TABLET | Freq: Every morning | ORAL | Status: DC

## 2021-11-24 MED ORDER — PROPOFOL 10 MG/ML IV BOLUS
INTRAVENOUS | Status: DC | PRN
  Administered 2021-11-24: 100 mg via INTRAVENOUS

## 2021-11-24 MED ORDER — LIDOCAINE 2% (20 MG/ML) 5 ML SYRINGE
INTRAMUSCULAR | Status: AC
  Filled 2021-11-24: qty 5

## 2021-11-24 MED ORDER — CHLORHEXIDINE GLUCONATE CLOTH 2 % EX PADS: 6.0000 | MEDICATED_PAD | Freq: Once | CUTANEOUS | Status: DC

## 2021-11-24 MED ORDER — OXYCODONE HCL 5 MG PO TABS
5.0000 mg | ORAL_TABLET | Freq: Once | ORAL | Status: AC | PRN
  Administered 2021-11-24: 5 mg via ORAL

## 2021-11-24 MED ORDER — LIDOCAINE 2% (20 MG/ML) 5 ML SYRINGE
INTRAMUSCULAR | Status: DC | PRN
  Administered 2021-11-24: 20 mg via INTRAVENOUS

## 2021-11-24 MED ORDER — OXYCODONE HCL 5 MG/5ML PO SOLN: 5.0000 mg | Freq: Once | ORAL | Status: AC | PRN

## 2021-11-24 MED ORDER — 0.9 % SODIUM CHLORIDE (POUR BTL) OPTIME
TOPICAL | Status: DC | PRN
  Administered 2021-11-24: 1000 mL

## 2021-11-24 MED ORDER — ONDANSETRON HCL 4 MG PO TABS: 4.0000 mg | ORAL_TABLET | Freq: Four times a day (QID) | ORAL | Status: DC | PRN

## 2021-11-24 MED ORDER — SODIUM CHLORIDE 0.9% FLUSH
3.0000 mL | Freq: Two times a day (BID) | INTRAVENOUS | Status: DC
  Administered 2021-11-24: 3 mL via INTRAVENOUS

## 2021-11-24 MED ORDER — THROMBIN 5000 UNITS EX SOLR
OROMUCOSAL | Status: DC | PRN
  Administered 2021-11-24: 5 mL via TOPICAL

## 2021-11-24 MED ORDER — MENTHOL 3 MG MT LOZG: 1.0000 | LOZENGE | OROMUCOSAL | Status: DC | PRN

## 2021-11-24 MED ORDER — OXYCODONE-ACETAMINOPHEN 5-325 MG PO TABS: 1.0000 | ORAL_TABLET | ORAL | 0 refills | Status: AC | PRN

## 2021-11-24 MED ORDER — SUGAMMADEX SODIUM 200 MG/2ML IV SOLN
INTRAVENOUS | Status: DC | PRN
  Administered 2021-11-24 (×2): 100 mg via INTRAVENOUS
  Administered 2021-11-24: 200 mg via INTRAVENOUS

## 2021-11-24 MED ORDER — PHENOL 1.4 % MT LIQD: 1.0000 | OROMUCOSAL | Status: DC | PRN

## 2021-11-24 MED ORDER — ROCURONIUM BROMIDE 10 MG/ML (PF) SYRINGE
PREFILLED_SYRINGE | INTRAVENOUS | Status: AC
  Filled 2021-11-24: qty 10

## 2021-11-24 MED ORDER — LACTATED RINGERS IV SOLN: INTRAVENOUS | Status: DC | PRN

## 2021-11-24 MED ORDER — ONDANSETRON HCL 4 MG/2ML IJ SOLN
INTRAMUSCULAR | Status: AC
  Filled 2021-11-24: qty 2

## 2021-11-24 MED ORDER — MIDAZOLAM HCL 5 MG/5ML IJ SOLN
INTRAMUSCULAR | Status: DC | PRN
  Administered 2021-11-24: 2 mg via INTRAVENOUS

## 2021-11-24 MED ORDER — HYDROMORPHONE HCL 1 MG/ML IJ SOLN
INTRAMUSCULAR | Status: AC
  Filled 2021-11-24: qty 1

## 2021-11-24 MED ORDER — ALBUTEROL SULFATE (2.5 MG/3ML) 0.083% IN NEBU: 3.0000 mL | INHALATION_SOLUTION | Freq: Four times a day (QID) | RESPIRATORY_TRACT | Status: DC | PRN

## 2021-11-24 MED ORDER — DEXAMETHASONE SODIUM PHOSPHATE 10 MG/ML IJ SOLN
INTRAMUSCULAR | Status: DC | PRN
Start: 2021-11-24 — End: 2021-11-24
  Administered 2021-11-24: 5 mg via INTRAVENOUS

## 2021-11-24 MED ORDER — THROMBIN 5000 UNITS EX SOLR
CUTANEOUS | Status: AC
  Filled 2021-11-24: qty 5000

## 2021-11-24 MED ORDER — ASPIRIN EC 81 MG PO TBEC
81.0000 mg | DELAYED_RELEASE_TABLET | Freq: Every evening | ORAL | Status: DC
  Administered 2021-11-24: 81 mg via ORAL
  Filled 2021-11-24: qty 1

## 2021-11-24 MED ORDER — OXYCODONE HCL 5 MG PO TABS
10.0000 mg | ORAL_TABLET | ORAL | Status: DC | PRN
  Administered 2021-11-24: 10 mg via ORAL
  Filled 2021-11-24: qty 2

## 2021-11-24 MED ORDER — GABAPENTIN 300 MG PO CAPS
300.0000 mg | ORAL_CAPSULE | ORAL | Status: DC
  Filled 2021-11-24: qty 1

## 2021-11-24 MED ORDER — DEXAMETHASONE 4 MG PO TABS
4.0000 mg | ORAL_TABLET | Freq: Four times a day (QID) | ORAL | Status: DC
  Administered 2021-11-24 (×2): 4 mg via ORAL
  Filled 2021-11-24 (×2): qty 1

## 2021-11-24 MED ORDER — SODIUM CHLORIDE 0.9 % IV SOLN: 250.0000 mL | INTRAVENOUS | Status: DC

## 2021-11-24 MED ORDER — PHENYLEPHRINE 40 MCG/ML (10ML) SYRINGE FOR IV PUSH (FOR BLOOD PRESSURE SUPPORT)
PREFILLED_SYRINGE | INTRAVENOUS | Status: DC | PRN
  Administered 2021-11-24: 40 ug via INTRAVENOUS
  Administered 2021-11-24 (×2): 80 ug via INTRAVENOUS

## 2021-11-24 MED ORDER — ACETAMINOPHEN 500 MG PO TABS
1000.0000 mg | ORAL_TABLET | Freq: Four times a day (QID) | ORAL | Status: DC
  Administered 2021-11-24 (×2): 1000 mg via ORAL
  Filled 2021-11-24 (×2): qty 2

## 2021-11-24 MED ORDER — FENTANYL CITRATE (PF) 250 MCG/5ML IJ SOLN
INTRAMUSCULAR | Status: AC
  Filled 2021-11-24: qty 5

## 2021-11-24 MED ORDER — METHOCARBAMOL 1000 MG/10ML IJ SOLN
500.0000 mg | Freq: Four times a day (QID) | INTRAVENOUS | Status: DC | PRN
  Filled 2021-11-24: qty 5

## 2021-11-24 MED ORDER — PROPOFOL 10 MG/ML IV BOLUS
INTRAVENOUS | Status: AC
  Filled 2021-11-24: qty 20

## 2021-11-24 MED ORDER — MIDAZOLAM HCL 2 MG/2ML IJ SOLN
INTRAMUSCULAR | Status: AC
  Filled 2021-11-24: qty 2

## 2021-11-24 MED ORDER — METHOCARBAMOL 500 MG PO TABS: 500.0000 mg | ORAL_TABLET | Freq: Four times a day (QID) | ORAL | 0 refills | Status: AC

## 2021-11-24 MED ORDER — POTASSIUM 99 MG PO TABS: 99.0000 mg | ORAL_TABLET | Freq: Every morning | ORAL | Status: DC

## 2021-11-24 MED ORDER — CEFAZOLIN SODIUM-DEXTROSE 2-4 GM/100ML-% IV SOLN
2.0000 g | Freq: Three times a day (TID) | INTRAVENOUS | Status: DC
  Administered 2021-11-24: 2 g via INTRAVENOUS
  Filled 2021-11-24: qty 100

## 2021-11-24 MED ORDER — POTASSIUM CHLORIDE IN NACL 20-0.9 MEQ/L-% IV SOLN: INTRAVENOUS | Status: DC

## 2021-11-24 MED ORDER — BUPIVACAINE HCL (PF) 0.25 % IJ SOLN
INTRAMUSCULAR | Status: DC | PRN
  Administered 2021-11-24: 10 mL
  Administered 2021-11-24: 7 mL

## 2021-11-24 MED ORDER — CHLORHEXIDINE GLUCONATE 0.12 % MT SOLN
15.0000 mL | Freq: Once | OROMUCOSAL | Status: AC
  Administered 2021-11-24: 15 mL via OROMUCOSAL
  Filled 2021-11-24: qty 15

## 2021-11-24 MED ORDER — METHOCARBAMOL 500 MG PO TABS
500.0000 mg | ORAL_TABLET | Freq: Four times a day (QID) | ORAL | Status: DC | PRN
  Administered 2021-11-24: 500 mg via ORAL
  Filled 2021-11-24: qty 1

## 2021-11-24 MED ORDER — DEXAMETHASONE SODIUM PHOSPHATE 10 MG/ML IJ SOLN
INTRAMUSCULAR | Status: AC
  Filled 2021-11-24: qty 1

## 2021-11-24 MED ORDER — THROMBIN 20000 UNITS EX SOLR
CUTANEOUS | Status: DC | PRN
  Administered 2021-11-24: 20 mL via TOPICAL

## 2021-11-24 MED ORDER — HYDROMORPHONE HCL 1 MG/ML IJ SOLN: 0.5000 mg | INTRAMUSCULAR | Status: DC | PRN

## 2021-11-24 MED ORDER — PHENYLEPHRINE 40 MCG/ML (10ML) SYRINGE FOR IV PUSH (FOR BLOOD PRESSURE SUPPORT)
PREFILLED_SYRINGE | INTRAVENOUS | Status: AC
  Filled 2021-11-24: qty 10

## 2021-11-24 MED ORDER — CELECOXIB 200 MG PO CAPS
200.0000 mg | ORAL_CAPSULE | Freq: Two times a day (BID) | ORAL | Status: DC
  Administered 2021-11-24: 200 mg via ORAL
  Filled 2021-11-24: qty 1

## 2021-11-24 MED ORDER — GABAPENTIN 300 MG PO CAPS
600.0000 mg | ORAL_CAPSULE | Freq: Three times a day (TID) | ORAL | Status: DC
  Administered 2021-11-24: 600 mg via ORAL
  Filled 2021-11-24: qty 2

## 2021-11-24 MED ORDER — HYDROMORPHONE HCL 1 MG/ML IJ SOLN
0.2500 mg | INTRAMUSCULAR | Status: DC | PRN
  Administered 2021-11-24: 0.5 mg via INTRAVENOUS

## 2021-11-24 MED ORDER — PHENYLEPHRINE HCL-NACL 20-0.9 MG/250ML-% IV SOLN
INTRAVENOUS | Status: DC | PRN
  Administered 2021-11-24: 30 ug/min via INTRAVENOUS

## 2021-11-24 MED ORDER — THROMBIN 20000 UNITS EX SOLR
CUTANEOUS | Status: AC
  Filled 2021-11-24: qty 20000

## 2021-11-24 MED ORDER — KETAMINE HCL 50 MG/5ML IJ SOSY
PREFILLED_SYRINGE | INTRAMUSCULAR | Status: AC
  Filled 2021-11-24: qty 5

## 2021-11-24 MED ORDER — ACETAMINOPHEN 500 MG PO TABS
1000.0000 mg | ORAL_TABLET | ORAL | Status: DC
  Filled 2021-11-24: qty 2

## 2021-11-24 MED ORDER — ROCURONIUM BROMIDE 10 MG/ML (PF) SYRINGE
PREFILLED_SYRINGE | INTRAVENOUS | Status: DC | PRN
  Administered 2021-11-24: 70 mg via INTRAVENOUS
  Administered 2021-11-24: 10 mg via INTRAVENOUS
  Administered 2021-11-24: 30 mg via INTRAVENOUS
  Administered 2021-11-24: 10 mg via INTRAVENOUS

## 2021-11-24 MED ORDER — ADULT MULTIVITAMIN W/MINERALS CH: 1.0000 | ORAL_TABLET | Freq: Every morning | ORAL | Status: DC

## 2021-11-24 MED ORDER — LACTATED RINGERS IV SOLN: INTRAVENOUS | Status: DC

## 2021-11-24 MED ORDER — CEFAZOLIN SODIUM-DEXTROSE 2-4 GM/100ML-% IV SOLN
2.0000 g | INTRAVENOUS | Status: AC
  Administered 2021-11-24: 2 g via INTRAVENOUS
  Filled 2021-11-24: qty 100

## 2021-11-24 MED ORDER — ORAL CARE MOUTH RINSE: 15.0000 mL | Freq: Once | OROMUCOSAL | Status: AC

## 2021-11-24 MED ORDER — BUPIVACAINE HCL (PF) 0.25 % IJ SOLN
INTRAMUSCULAR | Status: AC
  Filled 2021-11-24: qty 30

## 2021-11-24 MED ORDER — ALBUMIN HUMAN 5 % IV SOLN: INTRAVENOUS | Status: DC | PRN

## 2021-11-24 MED ORDER — FENTANYL CITRATE (PF) 250 MCG/5ML IJ SOLN
INTRAMUSCULAR | Status: DC | PRN
  Administered 2021-11-24: 100 ug via INTRAVENOUS
  Administered 2021-11-24 (×3): 50 ug via INTRAVENOUS

## 2021-11-24 MED ORDER — SODIUM CHLORIDE 0.9% FLUSH: 3.0000 mL | INTRAVENOUS | Status: DC | PRN

## 2021-11-24 MED ORDER — KETAMINE HCL 10 MG/ML IJ SOLN
INTRAMUSCULAR | Status: DC | PRN
  Administered 2021-11-24: 20 mg via INTRAVENOUS

## 2021-11-24 MED ORDER — SENNA 8.6 MG PO TABS: 1.0000 | ORAL_TABLET | Freq: Two times a day (BID) | ORAL | Status: DC

## 2021-11-24 MED ORDER — ALBUTEROL SULFATE HFA 108 (90 BASE) MCG/ACT IN AERS
INHALATION_SPRAY | RESPIRATORY_TRACT | Status: DC | PRN
  Administered 2021-11-24 (×3): 2 via RESPIRATORY_TRACT

## 2021-11-24 MED ORDER — MEPERIDINE HCL 25 MG/ML IJ SOLN: 6.2500 mg | INTRAMUSCULAR | Status: DC | PRN

## 2021-11-24 MED ORDER — MIDAZOLAM HCL 2 MG/2ML IJ SOLN: 0.5000 mg | Freq: Once | INTRAMUSCULAR | Status: DC | PRN

## 2021-11-24 MED ORDER — OXYCODONE HCL 5 MG PO TABS
ORAL_TABLET | ORAL | Status: AC
  Filled 2021-11-24: qty 1

## 2021-11-24 MED ORDER — ONDANSETRON HCL 4 MG/2ML IJ SOLN: 4.0000 mg | Freq: Four times a day (QID) | INTRAMUSCULAR | Status: DC | PRN

## 2021-11-24 SURGICAL SUPPLY — 66 items
ADH SKN CLS APL DERMABOND .7 (GAUZE/BANDAGES/DRESSINGS) ×1
APL SKNCLS STERI-STRIP NONHPOA (GAUZE/BANDAGES/DRESSINGS) ×1
BAG COUNTER SPONGE SURGICOUNT (BAG) ×3 IMPLANT
BAG SPNG CNTER NS LX DISP (BAG) ×1
BASKET BONE COLLECTION (BASKET) ×3 IMPLANT
BENZOIN TINCTURE PRP APPL 2/3 (GAUZE/BANDAGES/DRESSINGS) ×3 IMPLANT
BLADE CLIPPER SURG (BLADE) IMPLANT
BUR CARBIDE MATCH 3.0 (BURR) ×3 IMPLANT
CAGE POROUS TI 12X9X25 10D (Cage) ×2 IMPLANT
CANISTER SUCT 3000ML PPV (MISCELLANEOUS) ×3 IMPLANT
CLSR STERI-STRIP ANTIMIC 1/2X4 (GAUZE/BANDAGES/DRESSINGS) ×1 IMPLANT
CNTNR URN SCR LID CUP LEK RST (MISCELLANEOUS) ×2 IMPLANT
CONT SPEC 4OZ STRL OR WHT (MISCELLANEOUS) ×2
COVER BACK TABLE 60X90IN (DRAPES) ×3 IMPLANT
DERMABOND ADVANCED (GAUZE/BANDAGES/DRESSINGS) ×1
DERMABOND ADVANCED .7 DNX12 (GAUZE/BANDAGES/DRESSINGS) ×2 IMPLANT
DRAPE C-ARM 42X72 X-RAY (DRAPES) ×5 IMPLANT
DRAPE C-ARMOR (DRAPES) ×1 IMPLANT
DRAPE LAPAROTOMY 100X72X124 (DRAPES) ×3 IMPLANT
DRAPE SURG 17X23 STRL (DRAPES) ×3 IMPLANT
DRSG OPSITE POSTOP 4X6 (GAUZE/BANDAGES/DRESSINGS) ×1 IMPLANT
DURAPREP 26ML APPLICATOR (WOUND CARE) ×3 IMPLANT
ELECT REM PT RETURN 9FT ADLT (ELECTROSURGICAL) ×2
ELECTRODE REM PT RTRN 9FT ADLT (ELECTROSURGICAL) ×2 IMPLANT
EVACUATOR 1/8 PVC DRAIN (DRAIN) ×2 IMPLANT
GAUZE 4X4 16PLY ~~LOC~~+RFID DBL (SPONGE) ×1 IMPLANT
GLOVE SURG ENC MOIS LTX SZ7 (GLOVE) ×2 IMPLANT
GLOVE SURG ENC MOIS LTX SZ8 (GLOVE) ×6 IMPLANT
GLOVE SURG UNDER POLY LF SZ7 (GLOVE) ×4 IMPLANT
GOWN STRL REUS W/ TWL LRG LVL3 (GOWN DISPOSABLE) IMPLANT
GOWN STRL REUS W/ TWL XL LVL3 (GOWN DISPOSABLE) ×4 IMPLANT
GOWN STRL REUS W/TWL 2XL LVL3 (GOWN DISPOSABLE) IMPLANT
GOWN STRL REUS W/TWL LRG LVL3 (GOWN DISPOSABLE) ×4
GOWN STRL REUS W/TWL XL LVL3 (GOWN DISPOSABLE) ×4
GRAFT BONE PROTEIOS XL 10CC (Orthopedic Implant) ×1 IMPLANT
HEMOSTAT POWDER KIT SURGIFOAM (HEMOSTASIS) ×1 IMPLANT
KIT BASIN OR (CUSTOM PROCEDURE TRAY) ×3 IMPLANT
KIT GRAFTMAG DEL NEURO DISP (NEUROSURGERY SUPPLIES) IMPLANT
KIT TURNOVER KIT B (KITS) ×3 IMPLANT
MATRIX SPINE STRIP NEOCORE 5CC (Putty) IMPLANT
MILL MEDIUM DISP (BLADE) ×1 IMPLANT
NDL HYPO 25X1 1.5 SAFETY (NEEDLE) ×2 IMPLANT
NEEDLE HYPO 25X1 1.5 SAFETY (NEEDLE) ×2 IMPLANT
NS IRRIG 1000ML POUR BTL (IV SOLUTION) ×3 IMPLANT
PACK LAMINECTOMY NEURO (CUSTOM PROCEDURE TRAY) ×3 IMPLANT
PAD ARMBOARD 7.5X6 YLW CONV (MISCELLANEOUS) ×9 IMPLANT
ROD LORD LIPPED TI 5.5X70 (Rod) ×2 IMPLANT
SCREW CORT SHANK MOD 6.5X40 (Screw) ×2 IMPLANT
SCREW KODIAK 6.5X45 (Screw) ×4 IMPLANT
SCREW POLYAXIAL TULIP (Screw) ×2 IMPLANT
SET SCREW (Screw) ×12 IMPLANT
SET SCREW SPNE (Screw) IMPLANT
SPACER IDENTITI PS 10X9X25 10D (Spacer) ×2 IMPLANT
SPONGE SURGIFOAM ABS GEL 100 (HEMOSTASIS) ×3 IMPLANT
SPONGE T-LAP 4X18 ~~LOC~~+RFID (SPONGE) ×2 IMPLANT
STRIP CLOSURE SKIN 1/2X4 (GAUZE/BANDAGES/DRESSINGS) ×6 IMPLANT
STRIP MATRIX NEOCORE 5CC (Putty) ×2 IMPLANT
SUT VIC AB 0 CT1 18XCR BRD8 (SUTURE) ×2 IMPLANT
SUT VIC AB 0 CT1 8-18 (SUTURE) ×2
SUT VIC AB 2-0 CP2 18 (SUTURE) ×3 IMPLANT
SUT VIC AB 3-0 SH 8-18 (SUTURE) ×6 IMPLANT
SYR CONTROL 10ML LL (SYRINGE) ×3 IMPLANT
TOWEL GREEN STERILE (TOWEL DISPOSABLE) ×3 IMPLANT
TOWEL GREEN STERILE FF (TOWEL DISPOSABLE) ×3 IMPLANT
TRAY FOLEY MTR SLVR 16FR STAT (SET/KITS/TRAYS/PACK) ×3 IMPLANT
WATER STERILE IRR 1000ML POUR (IV SOLUTION) ×3 IMPLANT

## 2021-11-24 NOTE — Anesthesia Procedure Notes (Addendum)
Procedure Name: Intubation Date/Time: 11/24/2021 7:55 AM Performed by: Theresa Mulligan, RN Pre-anesthesia Checklist: Patient identified, Emergency Drugs available, Suction available and Patient being monitored Patient Re-evaluated:Patient Re-evaluated prior to induction Oxygen Delivery Method: Circle system utilized Preoxygenation: Pre-oxygenation with 100% oxygen Induction Type: IV induction Ventilation: Two handed mask ventilation required and Oral airway inserted - appropriate to patient size Laryngoscope Size: Mac and 4 Grade View: Grade I Tube type: Oral Tube size: 7.5 mm Number of attempts: 1 Airway Equipment and Method: Stylet and Oral airway Placement Confirmation: ETT inserted through vocal cords under direct vision, positive ETCO2 and breath sounds checked- equal and bilateral Secured at: 23 cm Tube secured with: Tape Dental Injury: Teeth and Oropharynx as per pre-operative assessment  Comments: Intubation by [facility]

## 2021-11-24 NOTE — Anesthesia Postprocedure Evaluation (Signed)
Anesthesia Post Note  Patient: [name]  Procedure(s) Performed: Posterior Lumbar Interbody Fusion - Lumbar one-Lumbar two - Lumbar two-Lumbar three (Back)     Patient location during evaluation: PACU Anesthesia Type: General Level of consciousness: awake and alert, patient cooperative and oriented Pain management: pain level controlled Vital Signs Assessment: post-procedure vital signs reviewed and stable Respiratory status: spontaneous breathing, nonlabored ventilation and respiratory function stable Cardiovascular status: blood pressure returned to baseline and stable Postop Assessment: no apparent nausea or vomiting, adequate PO intake and able to ambulate Anesthetic complications: no   No notable events documented.  Last Vitals:  Vitals:   11/24/21 1200 11/24/21 1203  BP:  (!) 158/81  Pulse: 90   Resp: 20 (!) 24  Temp:    SpO2: 92%     Last Pain:  Vitals:   11/24/21 1145  TempSrc:   PainSc: 0-No pain                 Seaborn Nakama,E. Joachim Carton

## 2021-11-24 NOTE — Evaluation (Signed)
Occupational Therapy Evaluation and Discharge Patient Details Name: [name] MRN: [id number] DOB: Aug 25, 1951 Today's Date: 11/24/2021   History of Present Illness Pt is a 71 y/o male presenting s/p lumbar fusion at L1-L3. PMH includes LLE radicular pain, previous lumbar surgery (2019), bilateral hip surgeries, HTN, COPD, Pneumonia, and CPAP for sleep apnea.   Clinical Impression   This 71 yo male admitted and underwent above presents to acute OT with all education completed with him and son in room. No further OT needs, we will sign off.     Recommendations for follow up therapy are one component of a multi-disciplinary discharge planning process, led by the attending physician.  Recommendations may be updated based on patient status, additional functional criteria and insurance authorization.   Follow Up Recommendations  No OT follow up    Assistance Recommended at Discharge Frequent or constant Supervision/Assistance  Patient can return home with the following A little help with walking and/or transfers;A little help with bathing/dressing/bathroom;Help with stairs or ramp for entrance;Assistance with cooking/housework;Assist for transportation    Functional Status Assessment  Patient has had a recent decline in their functional status and demonstrates the ability to make significant improvements in function in a reasonable and predictable amount of time. (no skilled follow up OT needed, all education completed and per pt he has A prn at home)  Equipment Recommendations  None recommended by OT       Precautions / Restrictions Precautions Precautions: Fall;Back Precaution Booklet Issued: Yes (comment) Precaution Comments: reviewed verbally with patient Required Braces or Orthoses: Spinal Brace Spinal Brace: Lumbar corset Restrictions Weight Bearing Restrictions: No      Mobility Bed Mobility Overal bed mobility: Modified Independent Bed Mobility: Rolling, Sidelying to  Sit, Sit to Sidelying                Transfers Overall transfer level: Needs assistance Equipment used: None Transfers: Sit to/from Stand Sit to Stand: Supervision                  Balance Overall balance assessment: Mild deficits observed, not formally tested                                         ADL either performed or assessed with clinical judgement   ADL                                         General ADL Comments: Educated pt on use of wet wipes for back pericare to avoid as much twisting, not bending over sink for mouth care, using pillows in bed for better positioning, sequence of dressing, sit<>stand stance to keep back more straight     Vision Patient Visual Report: No change from baseline              Pertinent Vitals/Pain Pain Assessment Pain Assessment: Faces Faces Pain Scale: Hurts little more Pain Location: operative site Pain Descriptors / Indicators: Sore Pain Intervention(s): Limited activity within patient's tolerance, Monitored during session, Repositioned     Hand Dominance Right   Extremity/Trunk Assessment Upper Extremity Assessment Upper Extremity Assessment: Overall WFL for tasks assessed      Communication Communication Communication: No difficulties   Cognition Arousal/Alertness: Awake/alert Behavior During Therapy: WFL for tasks assessed/performed Overall Cognitive Status: Within  Functional Limits for tasks assessed                                                  Home Living Family/patient expects to be discharged to:: Private residence Living Arrangements: Spouse/significant other Available Help at Discharge: Family;Friend(s);Available 24 hours/day;Neighbor Type of Home: House Home Access: Stairs to enter [employer] of Steps: 2 Entrance Stairs-Rails: None Home Layout: Multi-level Alternate Level Stairs-Number of Steps: 8 Alternate Level  Stairs-Rails: Right Bathroom Shower/Tub: Occupational psychologist: Handicapped height Bathroom Accessibility: Yes   Home Equipment: Conservation officer, nature (2 wheels);Shower seat;Grab bars - toilet;Shower seat - built in;Grab bars - tub/shower;Hand held shower head;Other (comment) (lift chair)   Additional Comments: Pt states he never leaves his home unless he goes to an appointment then family takes him, has family and friends that can provide everything he needs; has groceries delivered to his house.      Prior Functioning/Environment Prior Level of Function : Needs assist       Physical Assist : Mobility (physical);ADLs (physical) Mobility (physical): Stairs ADLs (physical): Dressing;IADLs Mobility Comments: pt's son helps him up and down stairs when he does have to leave the house for doctor's appointment. ADLs Comments: Pt stated his wife helps him donn/doff socks and shoes.        OT Problem List: Decreased range of motion;Impaired balance (sitting and/or standing);Pain         OT Goals(Current goals can be found in the care plan section) Acute Rehab OT Goals Patient Stated Goal: to go home today         AM-PAC OT "6 Clicks" Daily Activity     Outcome Measure Help from another person eating meals?: None Help from another person taking care of personal grooming?: A Little Help from another person toileting, which includes using toliet, bedpan, or urinal?: A Little Help from another person bathing (including washing, rinsing, drying)?: A Lot Help from another person to put on and taking off regular upper body clothing?: A Little Help from another person to put on and taking off regular lower body clothing?: A Lot 6 Click Score: 17   End of Session    Activity Tolerance: Patient tolerated treatment well Patient left: in bed;with call bell/phone within reach;with family/visitor present  OT Visit Diagnosis: Muscle weakness (generalized) (M62.81);Pain Pain - part of  body:  (incisional)                Time: 1916-6060 OT Time Calculation (min): 20 min Charges:  OT General Charges $OT Visit: 1 Visit OT Evaluation $OT Eval Moderate Complexity: [name], OTR/L Acute [employer] Pager [phone] Office [phone]    Almon Register 11/24/2021, 5:00 PM

## 2021-11-24 NOTE — H&P (Signed)
Subjective: Patient is a 71 y.o. male admitted for plif. Onset of symptoms was several months ago, gradually worsening since that time.  The pain is rated severe, and is located at the across the lower back and radiates to LLE. The pain is described as aching and occurs all day. The symptoms have been progressive. Symptoms are exacerbated by exercise, standing, and walking for more than a few minutes. MRI or CT showed spondylolisthesis L2-3, stenosis L1-2 L2-3, synovial cyst L1-2L.    Past Medical History:  Diagnosis Date   COPD (chronic obstructive pulmonary disease) ([place])    Hypertension    Lumbar stenosis    Pneumonia    Sleep apnea    Uses a cpap    Past Surgical History:  Procedure Laterality Date   APPENDECTOMY     Artery of leg     Bilateral repair   JOINT REPLACEMENT     LUMBAR LAMINECTOMY/DECOMPRESSION MICRODISCECTOMY N/A 02/17/2018   Procedure: Lumbar Two-Three, Lumbar Three-Four, Lumbar Four-Five Laminectomy and Foraminotomy;  Surgeon: Eustace Moore, MD;  Location: [place];  Service: Neurosurgery;  Laterality: N/A;   SHOULDER ARTHROSCOPY W/ ROTATOR CUFF REPAIR     Bilateral   TOTAL HIP ARTHROPLASTY     bilateral    Prior to Admission medications   Medication Sig Start Date End Date Taking? Authorizing Provider  acetaminophen (TYLENOL) 500 MG tablet Take 500-1,000 mg by mouth every 6 (six) hours as needed (for pain.).   Yes [provider]  albuterol (VENTOLIN HFA) 108 (90 Base) MCG/ACT inhaler Inhale 2 puffs into the lungs every 6 (six) hours as needed for wheezing. 12/27/15  Yes [provider]  ALPRAZolam (XANAX) 0.5 MG tablet Take 0.5 mg by mouth at bedtime. 12/22/17  Yes [provider]  aspirin 81 MG EC tablet Take 81 mg by mouth every evening.   Yes [provider]  atorvastatin (LIPITOR) 20 MG tablet Take 20 mg by mouth at bedtime.   Yes [provider]  BIOTIN PO Take 1 tablet by mouth in the morning.   Yes [provider]  Cholecalciferol (VITAMIN D) 2000 units tablet Take 2,000 Units by mouth in the morning.   Yes [provider]  clopidogrel (PLAVIX) 75 MG tablet Take 75 mg by mouth in the morning. 09/18/21  Yes [provider]  gabapentin (NEURONTIN) 300 MG capsule Take 600 mg by mouth 3 (three) times daily.   Yes [provider]  losartan (COZAAR) 100 MG tablet Take 50 mg by mouth in the morning.   Yes [provider]  Multiple Vitamin (MULTIVITAMIN WITH MINERALS) TABS tablet Take 1 tablet by mouth in the morning.   Yes [provider]  Omega-3 Fatty Acids (FISH OIL) 300 MG CAPS Take 300 mg by mouth in the morning.   Yes [provider]  Potassium 99 MG TABS Take 99 mg by mouth in the morning.   Yes [provider]   Allergies  Allergen Reactions   Bupropion Other (See Comments)    Contraindicated - seizure in childhood (he has never taken medication, but we discussed it in [facility] for smoking cessation) Contraindicated - seizure in childhood (he has never taken medication, but we discussed it in [facility] for smoking cessation) Contraindicated - seizure in childhood (he has never taken medication, but we discussed it in OV for smoking cessation)    Codeine Itching   Morphine And Related Itching    Social History   Tobacco Use  Smoking status: Every Day    Packs/day: 0.25    Types: Cigarettes   Smokeless tobacco: Never  Substance Use Topics   Alcohol use: Yes    Comment: occasional    Family History  Problem Relation Age of Onset   Ovarian cancer Mother      Review of Systems  Positive ROS: neg  All other systems have been reviewed and were otherwise negative with the exception of those mentioned in the HPI and as above.  Objective: Vital signs in last 24 hours: Temp:  [97.6 F (36.4 C)] 97.6 F (36.4 C) (02/27 0539) Pulse Rate:  [84] 84 (02/27 0539) Resp:  [17] 17 (02/27 0539) BP: (142)/(60) 142/60 (02/27  0539) SpO2:  [93 %] 93 % (02/27 0539) Weight:  [100.2 kg] 100.2 kg (02/27 0612)  General Appearance: Alert, cooperative, no distress, appears stated age Head: Normocephalic, without obvious abnormality, atraumatic Eyes: PERRL, conjunctiva/corneas clear, EOM's intact    Neck: Supple, symmetrical, trachea midline Back: Symmetric, no curvature, ROM normal, no CVA tenderness Lungs:  respirations unlabored Heart: Regular rate and rhythm Abdomen: Soft, non-tender Extremities: Extremities normal, atraumatic, no cyanosis or edema Pulses: 2+ and symmetric all extremities Skin: Skin color, texture, turgor normal, no rashes or lesions  NEUROLOGIC:   Mental status: Alert and oriented x4,  no aphasia, good attention span, fund of knowledge, and memory Motor Exam - grossly normal Sensory Exam - grossly normal Reflexes: 1= Coordination - grossly normal Gait - grossly normal Balance - grossly normal Cranial Nerves: I: smell Not tested  II: visual acuity  OS: nl    OD: nl  II: visual fields Full to confrontation  II: pupils Equal, round, reactive to light  III,VII: ptosis None  III,IV,VI: extraocular muscles  Full ROM  V: mastication Normal  V: facial light touch sensation  Normal  V,VII: corneal reflex  Present  VII: facial muscle function - upper  Normal  VII: facial muscle function - lower Normal  VIII: hearing Not tested  IX: soft palate elevation  Normal  IX,X: gag reflex Present  XI: trapezius strength  5/5  XI: sternocleidomastoid strength 5/5  XI: neck flexion strength  5/5  XII: tongue strength  Normal    Data Review Lab Results  Component Value Date   WBC 6.3 11/20/2021   HGB 14.5 11/20/2021   HCT 43.6 11/20/2021   MCV 93.4 11/20/2021   PLT 166 11/20/2021   Lab Results  Component Value Date   NA 137 11/20/2021   K 3.8 11/20/2021   CL 99 11/20/2021   CO2 30 11/20/2021   BUN 9 11/20/2021   CREATININE 0.80 11/20/2021   GLUCOSE 92 11/20/2021   Lab Results   Component Value Date   INR 1.0 11/20/2021    Assessment/Plan:  Estimated body mass index is 29.97 kg/m as calculated from the following:   Height as of this encounter: 6' (1.829 m).   Weight as of this encounter: 100.2 kg. Patient admitted for LPIF L1-2 L2-3. Patient has failed a reasonable attempt at conservative therapy.  I explained the condition and procedure to the patient and answered any questions.  Patient wishes to proceed with procedure as planned. Understands risks/ benefits and typical outcomes of procedure.   Eustace Moore 11/24/2021 7:33 AM

## 2021-11-24 NOTE — Plan of Care (Signed)

## 2021-11-24 NOTE — Op Note (Signed)
11/24/2021  11:32 AM  PATIENT:  [name]  71 y.o. 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 Decompressive lumbar laminectomy, hemi facetectomy and foraminotomies L1-2 and L2-3 requiring more work than would be required for a simple exposure of the disk for PLIF in order to adequately decompress the neural elements and address the spinal stenosis, with resection of a left L1-2 synovial cyst 2. Posterior lumbar interbody fusion L1-2 and L2-3 using porous titanium interbody cages packed with morcellized allograft and autograft  3. Posterior fixation L1-L3 inclusive using Al[employer]atec cortical pedicle screws.  4. Intertransverse arthrodesis L1-L3 using morcellized autograft and allograft.  SURGEON:  Sherley Bounds, MD  ASSISTANTS: Glenford Peers FNP  ANESTHESIA:  General  EBL: 300 ml  Total I/O In: 1500 [I.V.:1000; IV Piggyback:500] Out: 751 [Urine:155; Blood:300]  BLOOD ADMINISTERED:none  DRAINS: none   INDICATION FOR PROCEDURE: This patient presented with back pain with severe left leg pain. Imaging revealed final stenosis L1-2 and L2-3 with a left-sided synovial cyst L1-2 with a postlaminectomy spondylolisthesis L2-3. The patient tried a reasonable attempt at conservative medical measures without relief. I recommended decompression and instrumented fusion to address the stenosis as well as the segmental  instability.  Patient understood the risks, benefits, and alternatives and potential outcomes and wished to proceed.  PROCEDURE DETAILS:  The patient was brought to the operating room. After induction of generalized endotracheal anesthesia the patient was rolled into the prone position on chest rolls and all pressure points were padded. The patient's lumbar region was cleaned and then prepped with DuraPrep and  draped in the usual sterile fashion. Anesthesia was injected and then a dorsal midline incision was made and carried down to the lumbosacral fascia. The fascia was opened and the paraspinous musculature was taken down in a subperiosteal fashion to expose L1-2 and L2-3. A self-retaining retractor was placed. Intraoperative fluoroscopy confirmed my level, and I started with placement of the L1 cortical pedicle screws. The pedicle screw entry zones were identified utilizing surface landmarks and  AP and lateral fluoroscopy. I scored the cortex with the high-speed drill and then used the hand drill to drill an upward and outward direction into the pedicle. I then tapped line to line. I then placed a 6.5 x 40 mm cortical pedicle screw into the pedicles of L1 bilaterally.    I then turned my attention to the decompression and complete lumbar laminectomies, hemi- facetectomies, and foraminotomies were performed at L1-2 and L2-3.  My nurse practitioner was directly involved in the decompression and exposure of the neural elements. the patient had significant spinal stenosis and this required more work than would be required for a simple exposure of the disc for posterior lumbar interbody fusion which would only require a limited laminotomy. Much more generous decompression and generous foraminotomy was undertaken in order to adequately decompress the neural elements and address the patient's leg pain. The yellow ligament was removed to expose the underlying dura and nerve roots, and generous foraminotomies were performed to adequately decompress the neural elements.  At L1-2 on the left there was a synovial cyst which was adherent to the dura.  I peeled this away from the dura and removed it en bloc while undercutting the lateral recess.  Both the exiting and traversing nerve roots were decompressed on both sides until a coronary dilator passed easily along  the nerve roots at both levels. Once the decompression was  complete, I turned my attention to the posterior lower lumbar interbody fusion. The epidural venous vasculature was coagulated and cut sharply. Disc space was incised and the initial discectomy was performed with pituitary rongeurs. The disc space was distracted with sequential distractors to a height of 12 mm at L1-2 and 10 mm at L2-3. We then used a series of scrapers and shavers to prepare the endplates for fusion. The midline was prepared with Epstein curettes. Once the complete discectomy was finished, we packed an appropriate sized interbody cage with local autograft and morcellized allograft, gently retracted the nerve root, and tapped the cage into position at L1-2 and L2-3.  The midline between the cages was packed with morselized autograft and allograft.   We then turned our attention to the placement of the lower pedicle screws. The pedicle screw entry zones were identified utilizing surface landmarks and fluoroscopy. I drilled into each pedicle utilizing the hand drill, and tapped each pedicle with the appropriate tap. We palpated with a ball probe to assure no break in the cortex. We then placed 0.5 x 45 mm pedicle screw into the pedicles bilaterally at L2 and L3 bilaterally.  My nurse practitioner assisted in placement of the pedicle screws.  We then decorticated the transverse processes and laid a mixture of morcellized autograft and allograft out over these to perform intertransverse arthrodesis at L1-2 and L2-3. We then placed lordotic rods into the multiaxial screw heads of the pedicle screws and locked these in position with the locking caps and anti-torque device. We then checked our construct with AP and lateral fluoroscopy. Irrigated with copious amounts of bacitracin-containing saline solution. Inspected the nerve roots once again to assure adequate decompression, lined to the dura with Gelfoam,  and then we closed the muscle and the fascia with 0 Vicryl. Closed the subcutaneous tissues with  2-0 Vicryl and subcuticular tissues with 3-0 Vicryl. The skin was closed with benzoin and Steri-Strips. Dressing was then applied, the patient was awakened from general anesthesia and transported to the recovery room in stable condition. At the end of the procedure all sponge, needle and instrument counts were correct.   PLAN OF CARE: admit to inpatient  PATIENT DISPOSITION:  PACU - hemodynamically stable.   Delay start of Pharmacological VTE agent (>24hrs) due to surgical blood loss or risk of bleeding:  yes

## 2021-11-24 NOTE — Care Management Obs Status (Signed)
[place] NOTIFICATION   Patient Details  Name: [name] MRN: [id number] Date of Birth: June 20, 1951   Medicare Observation Status Notification Given:  Ernesta Amble, RN 11/24/2021, 6:01 PM

## 2021-11-24 NOTE — Discharge Instructions (Signed)
Wound Care Keep the incision clean and dry remove the outer dressing in 2 days, leave the Steri-Strips intact.  Do not put any creams, lotions, or ointments on incision. Leave steri-strips on back.  They will fall off by themselves.  Activity Walk each and every day, increasing distance each day. No lifting greater than 5 lbs.  No lifting no bending no twisting no driving or riding a car unless coming back and forth to see me. If provided with back brace, wear when out of bed.  It is not necessary to wear brace in bed.  Diet Resume your normal diet.   Call Your Doctor If Any of These Occur Redness, drainage, or swelling at the wound.  Temperature greater than 101 degrees. Severe pain not relieved by pain medication. Incision starts to come apart.  Follow Up Appt Call today for appointment in 1-2 weeks ([phone]) or for problems.  If you have any hardware placed in your spine, you will need an x-ray before your appointment.

## 2021-11-24 NOTE — Discharge Summary (Signed)
Physician Discharge Summary  Patient ID: [name] MRN: [id number] DOB/AGE: Jul 25, 1951 71 y.o.  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 The patient tolerated the procedure well and was taken to the recovery room and then to the floor in stable condition. The hospital course was routine. There were no complications. The wound remained clean dry and intact. Pt had appropriate back soreness. No complaints of leg pain or new N/T/W. The patient remained afebrile with stable vital signs, and tolerated a regular diet. The patient continued to increase activities, and pain was well controlled with oral pain medications.   Consults: None  Significant Diagnostic Studies:  Results for orders placed or performed during the hospital encounter of 11/20/21  SARS CORONAVIRUS 2 (TAT 6-24 HRS) Nasopharyngeal Nasopharyngeal Swab   Specimen: Nasopharyngeal Swab  Result Value Ref Range   SARS Coronavirus 2 NEGATIVE NEGATIVE  Surgical PCR Screen   Specimen: Nasal Mucosa; Nasal Swab  Result Value Ref Range   MRSA, PCR NEGATIVE NEGATIVE   Staphylococcus aureus NEGATIVE NEGATIVE  Protime-INR  Result Value Ref Range   Prothrombin Time 13.0 11.4 - 15.2 seconds   INR 1.0 0.8 - 1.2  CBC  Result Value Ref Range   WBC 6.3 4.0 - 10.5 K/uL   RBC 4.67 4.22 - 5.81 MIL/uL   Hemoglobin 14.5 13.0 - 17.0 g/dL   HCT 43.6 39.0 - 52.0 %   MCV 93.4 80.0 - 100.0 fL   MCH 31.0 26.0 - 34.0 pg   MCHC 33.3 30.0 - 36.0 g/dL   RDW 14.1 11.5 - 15.5 %   Platelets 166 150 - 400 K/uL   nRBC 0.0 0.0 - 0.2 %  Basic metabolic panel  Result Value Ref Range   Sodium 137  135 - 145 mmol/L   Potassium 3.8 3.5 - 5.1 mmol/L   Chloride 99 98 - 111 mmol/L   CO2 30 22 - 32 mmol/L   Glucose, Bld 92 70 - 99 mg/dL   BUN 9 8 - 23 mg/dL   Creatinine, Ser 0.80 0.61 - 1.24 mg/dL   Calcium 8.9 8.9 - 10.3 mg/dL   GFR, Estimated >60 >60 mL/min   Anion gap 8 5 - 15  Type and screen [place]  Result Value Ref Range   ABO/RH(D) O POS    Antibody Screen NEG    Sample Expiration 12/04/2021,2359    Extend sample reason      NO TRANSFUSIONS OR PREGNANCY IN THE PAST 3 MONTHS Performed at [facility], [address] [place],  [address]     DG Lumbar Spine 2-3 Views  Result Date: 11/24/2021 CLINICAL DATA:  PLIF, L1-L2 EXAM: LUMBAR SPINE - 2-3 VIEW; DG C-ARM 1-60 MIN-NO REPORT COMPARISON:  11/06/2021 FLUOROSCOPY: Fluoroscopy time: 1:16 Air kerma: 66.75 mGy FINDINGS: Intraoperative fluoroscopic images of the lumbar spine demonstrate posterior discectomy and fusion of L1 through L3. IMPRESSION: Intraoperative fluoroscopic images of the lumbar spine demonstrate posterior discectomy and fusion of L1 through L3. Electronically Signed   By: Delanna Ahmadi M.D.   On: 11/24/2021 12:16   MR Lumbar Spine W Wo Contrast  Result Date: 10/28/2021 CLINICAL DATA:  Low  back pain with left leg pain, weakness and numbness for 3 years. History of lung cancer diagnosed 3 years ago. The patient underwent lumbar surgery in 2019. EXAM: MRI LUMBAR SPINE WITHOUT AND WITH CONTRAST TECHNIQUE: Multiplanar and multiecho pulse sequences of the lumbar spine were obtained without and with intravenous contrast. CONTRAST:  10 mL GADAVIST IV SOLN COMPARISON:  Plain films lumbar spine 06/28/2018. FINDINGS: Segmentation:  Standard. Alignment:  0.3 cm anterolisthesis L2 on L3 noted. Vertebrae: No fracture, evidence of discitis, or bone lesion. Mild degenerative endplate signal change anteriorly at L4-5 is present. Conus medullaris and cauda equina: Conus extends to the T12-L1 level. Conus  and cauda equina appear normal. Paraspinal and other soft tissues: Negative Disc levels: T11-12 and T12-L1 are imaged in the sagittal plane only. The central canal and foramina appear open at both levels with a shallow central protrusion seen at T12-L1. L1-2: Shallow disc bulge, moderate facet arthropathy and ligamentum flavum thickening. A synovial cyst measuring 0.8 cm craniocaudal by 0.5 cm AP by 0.2 cm transverse is seen off the anteromedial margin of the left facet joint. There is moderate central canal stenosis L1-2. The cyst results in narrowing in the left subarticular recess which could impact left L2 root. The foramina appear open. L2-3: Moderate to advanced facet degenerative disease. There is some disc uncovering and a shallow bulge. Ligamentum flavum thickening is also present. Moderate central canal stenosis and mild left foraminal narrowing are seen. The right foramen is open. L3-4: Status post posterior decompression. There is a shallow disc bulge and endplate spur but the central spinal canal and neural foramina appear open. L4-5: Status post posterior decompression. There is a shallow disc bulge and some endplate spurring. The central canal is open. Mild to moderate foraminal narrowing is worse on the right. L5-S1: Shallow broad-based disc bulge, mild facet arthropathy and ligamentum flavum thickening. There is mild central canal stenosis and mild to moderate foraminal narrowing, worse on the left. IMPRESSION: Spondylosis at L1-2 including a small synovial cyst off the anteromedial margin of the left facet cause narrowing in the left subarticular recess which could impact the left L2 root and moderate central canal stenosis overall. Moderate central canal stenosis and mild left foraminal narrowing at L2-3. Status post decompression at L3-4 and L4-5. The central canal is open both levels. Right greater than left mild to moderate foraminal narrowing at L4-5 is noted. Mild central canal stenosis and  mild to moderate left greater than right foraminal narrowing L5-S1. Electronically Signed   By: Inge Rise M.D.   On: 10/28/2021 08:43   DG C-Arm 1-60 Min-No Report  Result Date: 11/24/2021 CLINICAL DATA:  PLIF, L1-L2 EXAM: LUMBAR SPINE - 2-3 VIEW; DG C-ARM 1-60 MIN-NO REPORT COMPARISON:  11/06/2021 FLUOROSCOPY: Fluoroscopy time: 1:16 Air kerma: 66.75 mGy FINDINGS: Intraoperative fluoroscopic images of the lumbar spine demonstrate posterior discectomy and fusion of L1 through L3. IMPRESSION: Intraoperative fluoroscopic images of the lumbar spine demonstrate posterior discectomy and fusion of L1 through L3. Electronically Signed   By: Delanna Ahmadi M.D.   On: 11/24/2021 12:16   DG C-Arm 1-60 Min-No Report  Result Date: 11/24/2021 CLINICAL DATA:  PLIF, L1-L2 EXAM: LUMBAR SPINE - 2-3 VIEW; DG C-ARM 1-60 MIN-NO REPORT COMPARISON:  11/06/2021 FLUOROSCOPY: Fluoroscopy time: 1:16 Air kerma: 66.75 mGy FINDINGS: Intraoperative fluoroscopic images of the lumbar spine demonstrate posterior discectomy and fusion of L1 through L3. IMPRESSION: Intraoperative fluoroscopic images of the lumbar spine demonstrate posterior discectomy and fusion of L1 through L3. Electronically  Signed   By: Delanna Ahmadi M.D.   On: 11/24/2021 12:16   DG C-Arm 1-60 Min-No Report  Result Date: 11/24/2021 CLINICAL DATA:  PLIF, L1-L2 EXAM: LUMBAR SPINE - 2-3 VIEW; DG C-ARM 1-60 MIN-NO REPORT COMPARISON:  11/06/2021 FLUOROSCOPY: Fluoroscopy time: 1:16 Air kerma: 66.75 mGy FINDINGS: Intraoperative fluoroscopic images of the lumbar spine demonstrate posterior discectomy and fusion of L1 through L3. IMPRESSION: Intraoperative fluoroscopic images of the lumbar spine demonstrate posterior discectomy and fusion of L1 through L3. Electronically Signed   By: Delanna Ahmadi M.D.   On: 11/24/2021 12:16    Antibiotics:  Anti-infectives (From admission, onward)    Start     Dose/Rate Route Frequency Ordered Stop   11/24/21 1600  ceFAZolin  (ANCEF) IVPB 2g/100 mL premix        2 g 200 mL/hr over 30 Minutes Intravenous Every 8 hours 11/24/21 1310 11/25/21 0759   11/24/21 0600  ceFAZolin (ANCEF) IVPB 2g/100 mL premix        2 g 200 mL/hr over 30 Minutes Intravenous On call to O.R. 11/24/21 0545 11/24/21 0810       Discharge Exam: Blood pressure (!) 139/59, pulse 80, temperature 98 F (36.7 C), temperature source Oral, resp. rate 16, height 6' (1.829 m), weight 100.2 kg, SpO2 93 %. Neurologic: Grossly normal Ambulating and voiding well incsiion cdi   Discharge Medications:   Allergies as of 11/24/2021       Reactions   Bupropion Other (See Comments)   Contraindicated - seizure in childhood (he has never taken medication, but we discussed it in OV for smoking cessation) Contraindicated - seizure in childhood (he has never taken medication, but we discussed it in OV for smoking cessation) Contraindicated - seizure in childhood (he has never taken medication, but we discussed it in OV for smoking cessation)   Codeine Itching   Morphine And Related Itching        Medication List     STOP taking these medications    clopidogrel 75 MG tablet Commonly known as: PLAVIX       TAKE these medications    acetaminophen 500 MG tablet Commonly known as: TYLENOL Take 500-1,000 mg by mouth every 6 (six) hours as needed (for pain.).   albuterol 108 (90 Base) MCG/ACT inhaler Commonly known as: VENTOLIN HFA Inhale 2 puffs into the lungs every 6 (six) hours as needed for wheezing.   ALPRAZolam 0.5 MG tablet Commonly known as: XANAX Take 0.5 mg by mouth at bedtime.   aspirin 81 MG EC tablet Take 81 mg by mouth every evening.   atorvastatin 20 MG tablet Commonly known as: LIPITOR Take 20 mg by mouth at bedtime.   BIOTIN PO Take 1 tablet by mouth in the morning.   Fish Oil 300 MG Caps Take 300 mg by mouth in the morning.   gabapentin 300 MG capsule Commonly known as: NEURONTIN Take 600 mg by mouth 3 (three)  times daily.   losartan 100 MG tablet Commonly known as: COZAAR Take 50 mg by mouth in the morning.   methocarbamol 500 MG tablet Commonly known as: Robaxin Take 1 tablet (500 mg total) by mouth 4 (four) times daily.   multivitamin with minerals Tabs tablet Take 1 tablet by mouth in the morning.   oxyCODONE-acetaminophen 5-325 MG tablet Commonly known as: Percocet Take 1 tablet by mouth every 4 (four) hours as needed for severe pain.   Potassium 99 MG Tabs Take 99 mg by mouth in the  morning.   Vitamin D 50 MCG (2000 UT) tablet Take 2,000 Units by mouth in the morning.               Durable Medical Equipment  (From admission, onward)           Start     Ordered   11/24/21 1311  DME Walker rolling  Once       Question:  Patient needs a walker to treat with the following condition  Answer:  S/P lumbar fusion   11/24/21 1310   11/24/21 1311  DME 3 n 1  Once        11/24/21 1310            Disposition: home   Final Dx: PLIF L1-2, L2-3  Discharge Instructions      Remove dressing in 72 hours   Complete by: As directed    Call MD for:  difficulty breathing, headache or visual disturbances   Complete by: As directed    Call MD for:  hives   Complete by: As directed    Call MD for:  persistant dizziness or light-headedness   Complete by: As directed    Call MD for:  persistant nausea and vomiting   Complete by: As directed    Call MD for:  redness, tenderness, or signs of infection (pain, swelling, redness, odor or green/yellow discharge around incision site)   Complete by: As directed    Call MD for:  severe uncontrolled pain   Complete by: As directed    Call MD for:  temperature >100.4   Complete by: As directed    Diet - low sodium heart healthy   Complete by: As directed    Increase activity slowly   Complete by: As directed           Signed: Ocie Cornfield Lyden Redner 11/24/2021, 5:19 PM

## 2021-11-24 NOTE — Evaluation (Signed)
Physical Therapy Evaluation Patient Details Name: [name] MRN: [id number] DOB: 12-Nov-1950 Today's Date: 11/24/2021  History of Present Illness  Pt is a 71 y/o male presenting s/p lumbar fusion at L1-L3. PMH includes LLE radicular pain, previous lumbar surgery (2019), bilateral hip surgeries, HTN, COPD, Pneumonia, and CPAP for sleep apnea.  Clinical Impression  Pt presents s/p lumbar fusion L1-L3. Pt impairments include decreased strength, power, safety awareness, and balance with mobility tasks. Pt required min guard to min A for mobility tasks performed today. Pt navigated stairs using rails with increased unsteadiness when descending; reports family can assist as needed. Pt educated on wearing back brace, back precautions, and how to get in and out of a car. He is very eager to go home today. PT will continue to follow acutely.          Recommendations for follow up therapy are one component of a multi-disciplinary discharge planning process, led by the attending physician.  Recommendations may be updated based on patient status, additional functional criteria and insurance authorization.  Follow Up Recommendations Other (comment) (would benefit from outpatient PT once cleared by MD.)    Assistance Recommended at Discharge Frequent or constant Supervision/Assistance  Patient can return home with the following  A little help with walking and/or transfers;A little help with bathing/dressing/bathroom;Assistance with cooking/housework;Direct supervision/assist for medications management;Direct supervision/assist for financial management;Assist for transportation;Help with stairs or ramp for entrance    Equipment Recommendations None recommended by PT  Recommendations for Other Services       Functional Status Assessment Patient has had a recent decline in their functional status and demonstrates the ability to make significant improvements in function in a reasonable and predictable  amount of time.     Precautions / Restrictions Precautions Precautions: Fall;Back Precaution Booklet Issued: Yes (comment) Precaution Comments: reviewed verbally with patient Required Braces or Orthoses: Spinal Brace Spinal Brace: Lumbar corset Restrictions Weight Bearing Restrictions: No      Mobility  Bed Mobility Overal bed mobility: Needs Assistance Bed Mobility: Rolling, Sidelying to Sit, Sit to Sidelying Rolling: Supervision Sidelying to sit: Min guard     Sit to sidelying: Min assist General bed mobility comments: Pt required increased time for rolling to his R side and with bed mobility in general. Pt required min guard for safety and cues upon sitting to EOB. Pt needed min A to assist LE elevation to EOB upon sit to sidelying. Needed reminder for decreased twisting motion during bed mobility    Transfers Overall transfer level: Needs assistance Equipment used: Rolling walker (2 wheels) Transfers: Sit to/from Stand Sit to Stand: Min guard           General transfer comment: Min guard for safety in transfer and cues to stand erect to decrease bending motion of the back    Ambulation/Gait Ambulation/Gait assistance: Min guard Gait Distance (Feet): 75 Feet Assistive device: Rolling walker (2 wheels) Gait Pattern/deviations: Step-through pattern, Decreased stride length Gait velocity: decreased Gait velocity interpretation: 1.31 - 2.62 ft/sec, indicative of limited community ambulator   General Gait Details: Min guard for safety and distance limited secondary to pain. Pt picked up walker off the ground when turning and needed cues to keep the walker on the ground at all times. When turning, the pt also had one foot outside of the walker and required cues to correct.  Stairs Stairs: Yes Stairs assistance: Min guard Stair Management: One rail Right, Forwards, Step to pattern Number of Stairs: 4 General stair comments:  Pt did use the L rail in the stairwell when  turning and walking down the steps. Pt with increased unsteadiness descending steps. Required min guard for safety and steadying. Pt reports that he holds onto the wall and the railing at home  Wheelchair Mobility    Modified Rankin (Stroke Patients Only)       Balance Overall balance assessment: Mild deficits observed, not formally tested                                           Pertinent Vitals/Pain Pain Assessment Pain Assessment: Faces Faces Pain Scale: Hurts little more Pain Location: lumbar operative site Pain Descriptors / Indicators: Aching, Grimacing Pain Intervention(s): Monitored during session    Home Living Family/patient expects to be discharged to:: Private residence Living Arrangements: Spouse/significant other Available Help at Discharge: Family;Friend(s);Available 24 hours/day;Neighbor Type of Home: House Home Access: Stairs to enter Entrance Stairs-Rails: None Entrance Stairs-Number of Steps: 2 Alternate Level Stairs-Number of Steps: 8 Home Layout: Multi-level Home Equipment: Conservation officer, nature (2 wheels);Shower seat;Grab bars - toilet Additional Comments: Pt states he never leaves his home; has family and friends that can provide everything he needs; has groceries delivered to his house.    Prior Function Prior Level of Function : Needs assist       Physical Assist : Mobility (physical);ADLs (physical) Mobility (physical): Stairs ADLs (physical): Dressing;IADLs Mobility Comments: pt's son helps him up and down stairs when he does have to leave the house for doctor's appointment. ADLs Comments: Pt implies his wife helps him donn/doff socks and shoes. Pt reports "Oh, I don't do that, my wife helps me out" when asked about reaching his feet.     Hand Dominance        Extremity/Trunk Assessment   Upper Extremity Assessment Upper Extremity Assessment: Defer to OT evaluation    Lower Extremity Assessment Lower Extremity Assessment:  Generalized weakness;LLE deficits/detail LLE Deficits / Details: LLE pre-surgical radicular pain is decreased    Cervical / Trunk Assessment Cervical / Trunk Assessment: Back Surgery  Communication   Communication: No difficulties  Cognition Arousal/Alertness: Awake/alert Behavior During Therapy: WFL for tasks assessed/performed Overall Cognitive Status: Impaired/Different from baseline Area of Impairment: Safety/judgement                         Safety/Judgement: Decreased awareness of safety     General Comments: very adamant about going home; decreased safety awareness overall.        General Comments General comments (skin integrity, edema, etc.): Pt very eager to go home. Verbally reviewed and instructed pt in back precautions and how to get in and out of a car.    Exercises     Assessment/Plan    PT Assessment Patient needs continued PT services  PT Problem List Decreased strength;Decreased range of motion;Decreased activity tolerance;Decreased balance;Decreased mobility;Decreased safety awareness       PT Treatment Interventions DME instruction;Gait training;Stair training;Functional mobility training;Therapeutic activities;Balance training;Therapeutic exercise;Neuromuscular re-education;Patient/family education    PT Goals (Current goals can be found in the Care Plan section)  Acute Rehab PT Goals Patient Stated Goal: to go home as soon as possible PT Goal Formulation: With patient Time For Goal Achievement: 12/01/21 Potential to Achieve Goals: Good    Frequency Min 5X/week     Co-evaluation  AM-PAC PT "6 Clicks" Mobility  Outcome Measure Help needed turning from your back to your side while in a flat bed without using bedrails?: A Little Help needed moving from lying on your back to sitting on the side of a flat bed without using bedrails?: A Little Help needed moving to and from a bed to a chair (including a wheelchair)?: A  Little Help needed standing up from a chair using your arms (e.g., wheelchair or bedside chair)?: A Little Help needed to walk in hospital room?: A Little Help needed climbing 3-5 steps with a railing? : A Little 6 Click Score: 18    End of Session Equipment Utilized During Treatment: Back brace;Gait belt Activity Tolerance: Patient tolerated treatment well Patient left: in bed;with call bell/phone within reach;with family/visitor present Nurse Communication: Mobility status PT Visit Diagnosis: Other abnormalities of gait and mobility (R26.89);Muscle weakness (generalized) (M62.81);Difficulty in walking, not elsewhere classified (R26.2)    Time: 4540-9811 PT Time Calculation (min) (ACUTE ONLY): 18 min   Charges:   PT Evaluation $PT Eval Low Complexity: 1 Low          Jonne Ply, SPT  [place] 11/24/2021, 4:28 PM

## 2021-11-24 NOTE — Progress Notes (Signed)
Patient awaiting transport via wheelchair by RN for discharge home; in no acute distress nor complaints of pain nor discomfort; incision on his back with honeycomb dressing and is clean, dry and intact; room was checked and accounted for all his belongings; discharge instructions concerning his medications, incision care, follow up appointment and when to call the doctor as needed were all discussed with patient and his son and they expressed understanding on the instructions given.

## 2021-11-24 NOTE — Care Management CC44 (Signed)
Condition Code 44 Documentation Completed  Patient Details  Name: [name] MRN: [id number] Date of Birth: 27-Dec-1950   Condition Code 44 given:  Yes Patient signature on Condition Code 44 notice:  Yes Documentation of 2 MD's agreement:  Yes Code 44 added to claim:  Yes    Laurena Slimmer, RN 11/24/2021, 6:01 PM

## 2021-11-24 NOTE — Transfer of Care (Signed)
Immediate Anesthesia Transfer of Care Note  Patient: [name]  Procedure(s) Performed: Posterior Lumbar Interbody Fusion - Lumbar one-Lumbar two - Lumbar two-Lumbar three (Back)  Patient Location: PACU  Anesthesia Type:General  Level of Consciousness: drowsy and patient cooperative  Airway & Oxygen Therapy: Patient Spontanous Breathing and Patient connected to face mask oxygen  Post-op Assessment: Report given to RN, Post -op Vital signs reviewed and stable and Patient moving all extremities X 4  Post vital signs: Reviewed and stable  Last Vitals:  Vitals Value Taken Time  BP 169/81 11/24/21 1146  Temp    Pulse 97 11/24/21 1150  Resp 28 11/24/21 1150  SpO2 97 % 11/24/21 1150  Vitals shown include unvalidated device data.  Last Pain:  Vitals:   11/24/21 0606  TempSrc:   PainSc: 5       Patients Stated Pain Goal: 0 (25/95/63 8756)  Complications: No notable events documented.
